# Patient Record
Sex: Male | Born: 1985
Health system: Southern US, Community
[De-identification: ages and names within clinical notes are randomized; demographics above are authoritative.]

## PROBLEM LIST (undated history)

## (undated) DIAGNOSIS — F32A Depression, unspecified: Secondary | ICD-10-CM

## (undated) DIAGNOSIS — R1310 Dysphagia, unspecified: Secondary | ICD-10-CM

## (undated) DIAGNOSIS — K200 Eosinophilic esophagitis: Secondary | ICD-10-CM

## (undated) DIAGNOSIS — G039 Meningitis, unspecified: Secondary | ICD-10-CM

## (undated) DIAGNOSIS — F329 Major depressive disorder, single episode, unspecified: Secondary | ICD-10-CM

## (undated) DIAGNOSIS — T7840XA Allergy, unspecified, initial encounter: Secondary | ICD-10-CM

## (undated) DIAGNOSIS — E7800 Pure hypercholesterolemia, unspecified: Secondary | ICD-10-CM

## (undated) HISTORY — PX: CAUTERIZE INNER NOSE: SHX279

## (undated) HISTORY — DX: Depression, unspecified: F32.A

## (undated) HISTORY — PX: VASECTOMY: SHX75

## (undated) HISTORY — DX: Pure hypercholesterolemia, unspecified: E78.00

## (undated) HISTORY — DX: Eosinophilic esophagitis: K20.0

## (undated) HISTORY — DX: Dysphagia, unspecified: R13.10

## (undated) HISTORY — DX: Allergy, unspecified, initial encounter: T78.40XA

## (undated) HISTORY — DX: Major depressive disorder, single episode, unspecified: F32.9

---

## 2016-07-21 DIAGNOSIS — B354 Tinea corporis: Secondary | ICD-10-CM | POA: Diagnosis not present

## 2016-07-21 DIAGNOSIS — G43711 Chronic migraine without aura, intractable, with status migrainosus: Secondary | ICD-10-CM | POA: Diagnosis not present

## 2016-07-21 DIAGNOSIS — Z1389 Encounter for screening for other disorder: Secondary | ICD-10-CM | POA: Diagnosis not present

## 2016-07-21 DIAGNOSIS — L720 Epidermal cyst: Secondary | ICD-10-CM | POA: Diagnosis not present

## 2017-09-26 ENCOUNTER — Encounter: Payer: Self-pay | Admitting: Physician Assistant

## 2017-09-26 ENCOUNTER — Other Ambulatory Visit: Payer: Self-pay

## 2017-09-26 ENCOUNTER — Ambulatory Visit (INDEPENDENT_AMBULATORY_CARE_PROVIDER_SITE_OTHER): Payer: BLUE CROSS/BLUE SHIELD | Admitting: Physician Assistant

## 2017-09-26 ENCOUNTER — Ambulatory Visit: Payer: Self-pay | Admitting: Physician Assistant

## 2017-09-26 VITALS — BP 130/91 | HR 67 | Temp 98.7°F | Resp 20 | Ht 71.26 in | Wt 180.6 lb

## 2017-09-26 DIAGNOSIS — R1319 Other dysphagia: Secondary | ICD-10-CM

## 2017-09-26 DIAGNOSIS — R1310 Dysphagia, unspecified: Secondary | ICD-10-CM | POA: Diagnosis not present

## 2017-09-26 NOTE — Progress Notes (Signed)
Curtis Grimes  MRN: [id number] DOB: [birth year]  Subjective:  Curtis Grimes is a 32 y.o. male seen in office today for a chief complaint of dysphagia x 1.5-2 years.  It is progressively worsening.  Used to be only with hard food but has now progressed to soft foods.  Tolerates liquids fine.  Each time he eats feels like he gets something stuck at the bottom of his throat.  Has had near choking experiences a few times.  Had mentioned this to a provider about 1 year ago and was given Rx for omeprazole.  He took for 3 to 4 months.  Did not notice a difference.  Has taken Tums consistently for the past 3 months with no relief.  Denies aspirating foods, heartburn, belching, cough, chest pain, pain with swallowing, inability to swallow, weight loss, nausea, vomiting, abdominal pain, constipation, diarrhea, dizziness, lightheadedness, fever, and chills.  Denies new medications. Denies smoking.  Family history of hiatal hernia and father.    Review of Systems  Per HPI  There are no active problems to display for this patient.  Past Medical History:  Diagnosis Date  . Depression     No current outpatient medications on file prior to visit.   No current facility-administered medications on file prior to visit.     No Known Allergies    Social History   Socioeconomic History  . Marital status: Married    Spouse name: Not on file  . Number of children: 2  . Years of education: Not on file  . Highest education level: Not on file  Occupational History  . Not on file  Social Needs  . Financial resource strain: Not on file  . Food insecurity:    Worry: Not on file    Inability: Not on file  . Transportation needs:    Medical: Not on file    Non-medical: Not on file  Tobacco Use  . Smoking status: Never Smoker  . Smokeless tobacco: Never Used  Substance and Sexual Activity  . Alcohol use: Yes    Comment: occ  . Drug use: Never  . Sexual activity: Yes  Lifestyle  . Physical activity:     Days per week: Not on file    Minutes per session: Not on file  . Stress: Not on file  Relationships  . Social connections:    Talks on phone: Not on file    Gets together: Not on file    Attends religious service: Not on file    Active member of club or organization: Not on file    Attends meetings of clubs or organizations: Not on file    Relationship status: Not on file  . Intimate partner violence:    Fear of current or ex partner: Not on file    Emotionally abused: Not on file    Physically abused: Not on file    Forced sexual activity: Not on file  Other Topics Concern  . Not on file  Social History Narrative  . Not on file    Objective:  BP (!) 130/91 (BP Location: Left Arm, Patient Position: Sitting, Cuff Size: Normal)   Pulse 67   Temp 98.7 F (37.1 C) (Oral)   Resp 20   Ht 5' 11.26" (1.81 m)   Wt 180 lb 9.6 oz (81.9 kg)   SpO2 98%   BMI 25.01 kg/m   Physical Exam  Constitutional: He is oriented to person, place, and time. He appears well-developed  and well-nourished. No distress.  HENT:  Head: Normocephalic and atraumatic.  Right Ear: Tympanic membrane, external ear and ear canal normal.  Left Ear: Tympanic membrane, external ear and ear canal normal.  Mouth/Throat: Uvula is midline and mucous membranes are normal. No posterior oropharyngeal edema, posterior oropharyngeal erythema or tonsillar abscesses. No tonsillar exudate.  Eyes: Conjunctivae are normal.  Neck: Normal range of motion and phonation normal. No tracheal tenderness present. No tracheal deviation present. No thyromegaly present.  Cardiovascular: Normal rate, regular rhythm, normal heart sounds and intact distal pulses.  Pulmonary/Chest: Effort normal and breath sounds normal. He has no decreased breath sounds. He has no wheezes. He has no rhonchi. He has no rales.  Lymphadenopathy:       Head (right side): No submental, no submandibular, no tonsillar, no preauricular, no posterior auricular  and no occipital adenopathy present.       Head (left side): No submental, no submandibular, no tonsillar, no preauricular, no posterior auricular and no occipital adenopathy present.    He has no cervical adenopathy.       Right: No supraclavicular adenopathy present.       Left: No supraclavicular adenopathy present.  Neurological: He is alert and oriented to person, place, and time.  Skin: Skin is warm and dry.  Psychiatric: He has a normal mood and affect.  Vitals reviewed.    Wt Readings from Last 3 Encounters:  09/26/17 180 lb 9.6 oz (81.9 kg)    Assessment and Plan :  1. Esophageal dysphagia Pt is asx today.   He is overall well-appearing, no acute distress.  Vitals stable. Due to duration  and worsening of symptoms, recommend evaluation by GI for possible endoscopy or barium study. Will check CBC today for potential anemia. Pt has not experienced weight loss, which is reassuring.  He has not gained any benefit from prior trials of PPI.  Do recommend purchasing over-the-counter Nexium or Prilosec again daily  until evaluated by GI.  Also recommended eating soft foods.  Given education material on diet for dysphasia. Follow up here as needed.  - Ambulatory referral to Gastroenterology - CBC with Differential/Platelet   Benjiman CoreBrittany Vincen Bejar PA-C  [facility]  [facility] 09/26/2017 11:39 AM

## 2017-09-26 NOTE — Patient Instructions (Addendum)
I have placed a referral to GI. They should contact you within the next 2 weeks to schedule an appointment. In the meantime, I recommend purchasing over the counter nexium or prilosec and taking once daily. I also recommend eating soft foods until you are evaluated. Below is info about soft food diet for dysphagia. Follow up here after you seen GI for a complete physical exam.  In terms of elevated blood pressure, I would like you to check your blood pressure at least a couple times over the next week outside of the office and document these values. It is best if you check the blood pressure at different times in the day. Your goal is <140/90. If your values are consistently above this goal, please return to office for further evaluation. If you start to have chest pain, blurred vision, shortness of breath, severe headache, lower leg swelling, or nausea/vomiting please seek care immediately here or at the ED.   Dysphagia Dysphagia is trouble swallowing. This condition occurs when solids and liquids stick in a person's throat on the way down to the stomach, or when food takes longer to get to the stomach. You may have problems swallowing food, liquids, or both. You may also have pain while trying to swallow. It may take you more time and effort to swallow something. What are the causes? This condition is caused by:  Problems with the muscles. They may make it difficult for you to move food and liquids through the tube that connects your mouth to your stomach (esophagus). You may have ulcers, scar tissue, or inflammation that blocks the normal passage of food and liquids. Causes of these problems include: ? Acid reflux from your stomach into your esophagus (gastroesophageal reflux). ? Infections. ? Radiation treatment for cancer. ? Medicines taken without enough fluids to wash them down into your stomach.  Nerve problems. These prevent signals from being sent to the muscles of your esophagus to squeeze  (contract) and move what you swallow down to your stomach.  Globus pharyngeus. This is a common problem that involves feeling like something is stuck in the throat or a sense of trouble with swallowing even though nothing is wrong with the swallowing passages.  Stroke. This can affect the nerves and make it difficult to swallow.  Certain conditions, such as cerebral palsy or Parkinson disease.  What are the signs or symptoms? Common symptoms of this condition include:  A feeling that solids or liquids are stuck in your throat on the way down to the stomach.  Food taking too long to get to the stomach.  Other symptoms include:  Food moving back from your stomach to your mouth (regurgitation).  Noises coming from your throat.  Chest discomfort with swallowing.  A feeling of fullness when swallowing.  Drooling, especially when the throat is blocked.  Pain while swallowing.  Heartburn.  Coughing or gagging while trying to swallow.  How is this diagnosed? This condition is diagnosed by:  Barium X-ray. In this test, you swallow a white substance (contrast medium)that sticks to the inside of your esophagus. X-ray images are then taken.  Endoscopy. In this test, a flexible telescope is inserted down your throat to look at your esophagus and your stomach.  CT scans and MRI.  How is this treated? Treatment for dysphagia depends on the cause of the condition:  If the dysphagia is caused by acid reflux or infection, medicines may be used. They may include antibiotics and heartburn medicines.  If the  dysphagia is caused by problems with your muscles, swallowing therapy may be used to help you strengthen your swallowing muscles. You may have to do specific exercises to strengthen the muscles or stretch them.  If the dysphagia is caused by a blockage or mass, procedures to remove the blockage may be done. You may need surgery and a feeding tube.  You may need to make diet changes.  Ask your health care provider for specific instructions. Follow these instructions at home: Eating and drinking  Try to eat soft food that is easier to swallow.  Follow any diet changes as told by your health care provider.  Cut your food into small pieces and eat slowly.  Eat and drink only when you are sitting upright.  Do not drink alcohol or caffeine. If you need help quitting, ask your health care provider. General instructions  Check your weight every day to make sure you are not losing weight.  Take over-the-counter and prescription medicines only as told by your health care provider.  If you were prescribed an antibiotic medicine, take it as told by your health care provider. Do not stop taking the antibiotic even if you start to feel better.  Do not use any products that contain nicotine or tobacco, such as cigarettes and e-cigarettes. If you need help quitting, ask your health care provider.  Keep all follow-up visits as told by your health care provider. This is important. Contact a health care provider if:  You lose weight because you cannot swallow.  You cough when you drink liquids (aspiration).  You cough up partially digested food. Get help right away if:  You cannot swallow your saliva.  You have shortness of breath or a fever, or both.  You have a hoarse voice and also have trouble swallowing. Summary  Dysphagia is trouble swallowing. This condition occurs when solids and liquids stick in a person's throat on the way down to the stomach, or when food takes longer to get to the stomach.  Dysphagia has many possible causes and symptoms.  Treatment for dysphagia depends on the cause of the condition. This information is not intended to replace advice given to you by your health care provider. Make sure you discuss any questions you have with your health care provider. Document Released: 02/11/2000 Document Revised: 02/03/2016 Document Reviewed:  02/03/2016 Elsevier Interactive Patient Education  2017 Elsevier Inc.  Dysphagia Diet Level 2, Mechanically Altered The dysphagia level 2 diet includes foods that are blended, chopped, ground, or mashed so they are easier to chew and swallow. The foods are soft, moist, and can be chopped into -inch chunks (such as pancakes, pasta, and bananas). In order to be on this diet, you must be able to chew. This diet helps you transition between the pureed textures of the dysphagia level 1 diet to more solid textures. This diet is helpful for people with mild to moderate swallowing difficulties. It reduces the risk of food getting caught in the windpipe, trachea, or lungs. You may need help or supervision during meals while following this diet so that you eat safely. You will be on this diet until your health care provider advances the texture of your diet. What do I need to know about this diet? Foods  You may eat foods that are soft and moist. ? You may need to use a blender, whisk, or masher to soften some of your foods. ? You can moisten foods with gravies, sauces, vegetable or fruit juice, milk, half  and half, or water when blending, mashing, or grinding your foods to the right consistency.  If you were on the dysphagia level 1 diet, you may still eat any of the foods included in that diet.  Avoid foods that are dry, hard, sticky, chewy, coarse, and crunchy. Also avoid large cuts of food.  Take small bites. Each bite should contain  inch or less of food. Liquids  Avoid liquids with seeds and chunks.  Thicken liquids, if instructed by your health care provider. Your health care provider will tell you the consistency to which you should thicken your liquids for safe swallowing. To thicken a liquid, use a commercial thickener or a thickening food (such as rice cereal or potato flakes). Ask your health care provider for specific recommendations on thickeners. See your dietitian or health care  provider regularly for help with your dietary changes. What foods can I eat? Grains Store-bought soft breads that do not have nuts or seeds. Pancakes, sweet rolls, [place] pastries, and [place] toast that have been moistened with syrup or sauce to form a slurry when blended. Well-cooked pasta, noodles, and bread dressing. Well-cooked noodles and pasta in sauce. Moist macaroni and cheese. Soft dumplings or spaetzle with gravy or butter. Cooked cereals (including oatmeal). Low-texture dry cereals, such as rice puff, corn, or wheat-flake cereals, with milk (if thin liquids are not allowed, make sure all of the milk is absorbed by the cereal before eating it). Vegetables Very soft, well-cooked vegetables in pieces less than  inch in size. Cooked potatoes that are moist, not crispy, and with sauce. Fruits Canned or cooked fruits that are soft or moist and do not have skin or seeds. Fresh, soft bananas. Fruit juices with a small amount of pulp (if thin liquids are allowed). Gelatin or plain gelatin with canned fruit, except pineapple. Meat and Other Protein Sources Tender, moist meats, poultry, or fish cooked with gravy or sauce and cubed to -inch bites or smaller. Ground meat. Moist meatball or meatloaf. Fish without bones. Moist casseroles without rice. Tuna, egg, or meat salad without chunks or hard-to-chew vegetables, such as celery and onions. Smooth quiche without large chunks. Scrambled, poached, or soft-cooked eggs with butter, margarine, sauce, or gravy. Tofu. Well-cooked, moistened and mashed beans, peas, baked beans, and other legumes. Casseroles without rice (such as tuna noodle casserole or soft moist meat lasagna). Dairy Cream cheese. Yogurt. Cottage cheese. Ask your health care provider if milk is allowed. Sweets/Desserts Pudding. Custard. Soft fruit pies with crust on the bottom only. Crisps and cobblers without seeds or nuts and with soft crusts. Soft, moist cakes. Icing. Pre-gelled cookies.  Soft, moist cookies dunked in milk, coffee, or another liquid. Jelly. Soft, smooth chocolate bars that are easily chewed. Jams and preserves without seeds. Ask your health care provider whether you can have frozen desserts. Fats and Oils Butter. Margarine. Cream for cereal, depending on liquid consistency allowed. Gravy. Cream sauces. Mayonnaise. Salad dressings. Cream cheese. Cheese spreads, plain or with soft fruits or vegetables added. Sour cream. Sour cream dips with soft fruits or vegetables added. Whipped toppings. Other Sauces and salsas that have soft chunks that are about  inch or smaller. The items listed above may not be a complete list of recommended foods or beverages. Contact your dietitian for more options. What foods are not recommended? Grains All breads not listed in the recommended list. Breads that are hard or have nuts or seeds. Coarse cereals. Cereals that have nuts, seeds, dried fruits, or coconut.  Rice. Corn. Vegetables Whole, raw, frozen, or dried vegetables. Tough, fibrous, chewy, or stringy cooked vegetables, such as celery, peas, broccoli, cabbage, Brussels sprouts, and asparagus. Potato skins. Potato and other vegetable chips. Fried or French-fried potatoes. Cooked corn and peas. Fruits Whole raw, frozen, or dried fruits, including coconut. Pineapple. Fruits with seeds. Meat and Other Protein Sources Dry, tough meats, such as bacon, sausage, and hot dogs. Cheese slices and cubes. Peanut butter. Hard boiled or fried eggs. Nuts. Seeds. Pizza. Sandwiches. Dry casseroles or casseroles with rice or large chunks. Dairy Yogurt with nuts, seeds, or large chunks. Sweets/Desserts Coarse, hard, chewy, or sticky desserts. Any dessert with nuts, seeds, coconut, pineapple, or dried fruit. Ask your health care provider whether you can have frozen desserts. Fats and Oils Avoid fats with chunky, large textures, such as those with nuts or fruits. Other Soups and casseroles with  large chunks. The items listed above may not be a complete list of foods and beverages to avoid. Contact your dietitian for more information. This information is not intended to replace advice given to you by your health care provider. Make sure you discuss any questions you have with your health care provider. Document Released: 02/13/2005 Document Revised: 07/22/2015 Document Reviewed: 01/27/2013 Elsevier Interactive Patient Education  2018 [employer].    IF you received an x-ray today, you will receive an invoice from [facility]. Please contact [facility] at [phone] with questions or concerns regarding your invoice.   IF you received labwork today, you will receive an invoice from [facility]. Please contact [facility] at [phone] with questions or concerns regarding your invoice.   Our billing staff will not be able to assist you with questions regarding bills from these companies.  You will be contacted with the lab results as soon as they are available. The fastest way to get your results is to activate your My Chart account. Instructions are located on the last page of this paperwork. If you have not heard from [place] regarding the results in 2 weeks, please contact this office.

## 2017-09-27 LAB — CBC WITH DIFFERENTIAL/PLATELET
BASOS: 0 %
Basophils Absolute: 0 10*3/uL (ref 0.0–0.2)
EOS (ABSOLUTE): 0.5 10*3/uL — ABNORMAL HIGH (ref 0.0–0.4)
EOS: 6 %
HEMATOCRIT: 46.2 % (ref 37.5–51.0)
Hemoglobin: 15.4 g/dL (ref 13.0–17.7)
IMMATURE GRANS (ABS): 0 10*3/uL (ref 0.0–0.1)
IMMATURE GRANULOCYTES: 0 %
LYMPHS: 32 %
Lymphocytes Absolute: 2.8 10*3/uL (ref 0.7–3.1)
MCH: 29.2 pg (ref 26.6–33.0)
MCHC: 33.3 g/dL (ref 31.5–35.7)
MCV: 88 fL (ref 79–97)
MONOS ABS: 0.6 10*3/uL (ref 0.1–0.9)
Monocytes: 7 %
Neutrophils Absolute: 4.9 10*3/uL (ref 1.4–7.0)
Neutrophils: 55 %
PLATELETS: 295 10*3/uL (ref 150–450)
RBC: 5.27 x10E6/uL (ref 4.14–5.80)
RDW: 13.6 % (ref 12.3–15.4)
WBC: 8.9 10*3/uL (ref 3.4–10.8)

## 2017-10-01 ENCOUNTER — Encounter: Payer: Self-pay | Admitting: Physician Assistant

## 2017-10-03 ENCOUNTER — Ambulatory Visit: Payer: Self-pay | Admitting: Family Medicine

## 2017-11-26 ENCOUNTER — Encounter

## 2017-11-26 ENCOUNTER — Ambulatory Visit (INDEPENDENT_AMBULATORY_CARE_PROVIDER_SITE_OTHER): Payer: BLUE CROSS/BLUE SHIELD | Admitting: Gastroenterology

## 2017-11-26 ENCOUNTER — Encounter: Payer: Self-pay | Admitting: Gastroenterology

## 2017-11-26 VITALS — BP 118/70 | HR 74 | Ht 71.0 in | Wt 182.2 lb

## 2017-11-26 DIAGNOSIS — R1310 Dysphagia, unspecified: Secondary | ICD-10-CM

## 2017-11-26 NOTE — Patient Instructions (Addendum)
If you are age 32 or older, your body mass index should be between 23-30. Your Body mass index is 25.42 kg/m. If this is out of the aforementioned range listed, please consider follow up with your Primary Care Provider.  If you are age 50 or younger, your body mass index should be between 19-25. Your Body mass index is 25.42 kg/m. If this is out of the aformentioned range listed, please consider follow up with your Primary Care Provider.   You have been scheduled for an endoscopy. Please follow written instructions given to you at your visit today. If you use inhalers (even only as needed), please bring them with you on the day of your procedure. Your physician has requested that you go to www.startemmi.com and enter the access code given to you at your visit today. This web site gives a general overview about your procedure. However, you should still follow specific instructions given to you by our office regarding your preparation for the procedure.  Thank you for entrusting me with your care and for choosing [facility], Dr. Ileene Patrick

## 2017-11-26 NOTE — Progress Notes (Signed)
HPI :  32 year old healthy male referred by Magdalene River, PA for dysphagia.  The patient reports experiencing dysphagia for the past year, however this is been worse over the past few months appears to be progressive. He is has dysphagia mostly to solids, however this can rarely occur to liquids. It has been occurring more recently with most meals, often has to drink food to help push things down. Location of the dysphagia is in the upper chest. No odynophagia. It sounds like he had a transient impaction about a month and half ago while at work, food would not come up or go down, eventually passed after bit of time. He denies any impactions for which she's need to seek care in the ER. He has some very mild reflux symptoms at times,  He is been placed on Nexium 20 mg over the past month and a half which has not really helped his dysphagia at all. He denies any abdominal pains or vomiting. He denies any history of asthma or seasonal allergies. He has never had a prior EGD or prior evaluation for this.  He denies any wheezing, does endorse the sense that he needs to take a deep breath at times. He denies any exertional symptoms or chest pain. He thinks his father had a hiatal hernia but no family history of esophageal cancer.  Past Medical History:  Diagnosis Date  . Depression      Past Surgical History:  Procedure Laterality Date  . VASECTOMY     Family History  Problem Relation Age of Onset  . Diabetes Mother   . Heart disease Mother   . Hypertension Mother   . Diabetes Maternal Grandmother   . Heart disease Maternal Grandmother   . Stroke Maternal Grandmother   . Hypertension Paternal Grandmother    Social History   Tobacco Use  . Smoking status: Never Smoker  . Smokeless tobacco: Never Used  Substance Use Topics  . Alcohol use: Yes    Comment: occ  . Drug use: Never   Current Outpatient Medications  Medication Sig Dispense Refill  . esomeprazole (NEXIUM) 20 MG  capsule Take 20 mg by mouth daily at 12 noon.     No current facility-administered medications for this visit.    No Known Allergies   Review of Systems: All systems reviewed and negative except where noted in HPI.   Lab Results  Component Value Date   WBC 8.9 09/26/2017   HGB 15.4 09/26/2017   HCT 46.2 09/26/2017   MCV 88 09/26/2017   PLT 295 09/26/2017      Physical Exam: BP 118/70   Pulse 74   Ht 5\' 11"  (1.803 m)   Wt 182 lb 4 oz (82.7 kg)   BMI 25.42 kg/m  Constitutional: Pleasant,well-developed, male in no acute distress. HEENT: Normocephalic and atraumatic. Conjunctivae are normal. No scleral icterus. Neck supple.  Cardiovascular: Normal rate, regular rhythm.  Pulmonary/chest: Effort normal and breath sounds normal. No wheezing, rales or rhonchi. Abdominal: Soft, nondistended, nontender.  There are no masses palpable. No hepatomegaly. Extremities: no edema Lymphadenopathy: No cervical adenopathy noted. Neurological: Alert and oriented to person place and time. Skin: Skin is warm and dry. No rashes noted. Psychiatric: Normal mood and affect. Behavior is normal.   ASSESSMENT AND PLAN: 32 year old male here for new patient visit to assess the following:  Dysphagia - ongoing over the past year, progressive, with transient impaction last month. I discussed differential for him to include peptic stricture, eosinophilic  esophagitis, motility disorder, mass lesion (the latter which would be unusual at his age), etc. I'm recommending an EGD to further evaluate and potentially treat with dilation if amenable. He does have a mild peripheral eosinophilia, need to rule out EoE. I discussed the risks and benefits of EGD and anesthesia with him and he wanted to proceed. Further recommendations pending the results. He will continue Nexium until this is done. All questions answered he agreed with the plan.  Ileene Patrick, MD [facility]  CC: Benjiman Core  D, PA*

## 2017-11-27 ENCOUNTER — Ambulatory Visit (AMBULATORY_SURGERY_CENTER): Payer: BLUE CROSS/BLUE SHIELD | Admitting: Gastroenterology

## 2017-11-27 ENCOUNTER — Encounter: Payer: Self-pay | Admitting: Gastroenterology

## 2017-11-27 VITALS — BP 117/84 | HR 61 | Temp 98.9°F | Resp 23 | Ht 71.0 in | Wt 182.0 lb

## 2017-11-27 DIAGNOSIS — R1319 Other dysphagia: Secondary | ICD-10-CM

## 2017-11-27 DIAGNOSIS — R1310 Dysphagia, unspecified: Secondary | ICD-10-CM | POA: Diagnosis not present

## 2017-11-27 DIAGNOSIS — K222 Esophageal obstruction: Secondary | ICD-10-CM

## 2017-11-27 DIAGNOSIS — K228 Other specified diseases of esophagus: Secondary | ICD-10-CM

## 2017-11-27 DIAGNOSIS — K219 Gastro-esophageal reflux disease without esophagitis: Secondary | ICD-10-CM | POA: Diagnosis not present

## 2017-11-27 MED ORDER — SODIUM CHLORIDE 0.9 % IV SOLN: 500.0000 mL | Freq: Once | INTRAVENOUS | Status: DC

## 2017-11-27 NOTE — Progress Notes (Signed)
Report to PACU, RN, vss, BBS= Clear.  

## 2017-11-27 NOTE — Op Note (Signed)
[facility] Patient Name: Curtis Grimes Procedure Date: 11/27/2017 1:20 PM MRN: [id number] Endoscopist: Viviann Spare P. Adela Lank , MD Age: 32 Referring MD:  Date of Birth: 1985/03/25 Gender: Male Account #: [id number] Procedure:                Upper GI endoscopy Indications:              Dysphagia Medicines:                Monitored Anesthesia Care Procedure:                Pre-Anesthesia Assessment:                           - Prior to the procedure, a History and Physical                            was performed, and patient medications and                            allergies were reviewed. The patient's tolerance of                            previous anesthesia was also reviewed. The risks                            and benefits of the procedure and the sedation                            options and risks were discussed with the patient.                            All questions were answered, and informed consent                            was obtained. Prior Anticoagulants: The patient has                            taken no previous anticoagulant or antiplatelet                            agents. ASA Grade Assessment: II - A patient with                            mild systemic disease. After reviewing the risks                            and benefits, the patient was deemed in                            satisfactory condition to undergo the procedure.                           After obtaining informed consent, the endoscope was  passed under direct vision. Throughout the                            procedure, the patient's blood pressure, pulse, and                            oxygen saturations were monitored continuously. The                            Model GIF-HQ190 [id number]) scope was introduced                            through the mouth, and advanced to the second part                            of duodenum. The upper GI endoscopy was                           accomplished without difficulty. The patient                            tolerated the procedure well. Scope In: Scope Out: Findings:                 Esophagogastric landmarks were identified: the                            Z-line was found at 40 cm, the gastroesophageal                            junction was found at 40 cm and the upper extent of                            the gastric folds was found at 40 cm from the                            incisors.                           One benign-appearing, intrinsic mild stenosis was                            found 40 cm from the incisors. This stenosis                            measured less than one cm (in length). A TTS                            dilator was passed through the scope. Dilation with                            a 16-17-18 mm balloon dilator was performed to 16  mm and 17 mm at which point an appropriate mucosal                            wrent was apprecited.                           Mucosal changes including ringed esophagus and                            longitudinal furrows were found in the middle third                            of the esophagus and in the lower third of the                            esophagus. Biopsies were obtained from the proximal                            and distal esophagus with cold forceps for                            histology of suspected eosinophilic esophagitis.                           The exam of the esophagus was otherwise normal.                           The entire examined stomach was normal.                           The duodenal bulb and second portion of the                            duodenum were normal. Complications:            No immediate complications. Estimated blood loss:                            Minimal. Estimated Blood Loss:     Estimated blood loss was minimal. Impression:               - Esophagogastric  landmarks identified.                           - Benign-appearing esophageal stenosis. Dilated to                            17mm with good result.                           - Esophageal mucosal changes suspicious for                            eosinophilic esophagitis. Biopsied.                           -  Normal stomach.                           - Normal duodenal bulb and second portion of the                            duodenum. Recommendation:           - Patient has a contact number available for                            emergencies. The signs and symptoms of potential                            delayed complications were discussed with the                            patient. Return to normal activities tomorrow.                            Written discharge instructions were provided to the                            patient.                           - Post-dilation diet.                           - Continue present medications.                           - Await pathology results and course post-dilation                            with further recommendations Viviann Spare P. Armbruster, MD 11/27/2017 1:38:53 PM This report has been signed electronically.

## 2017-11-27 NOTE — Patient Instructions (Signed)
*   handout on stricture and dilation diet instructions given*  YOU HAD AN ENDOSCOPIC PROCEDURE TODAY AT THE [facility]:   Refer to the procedure report that was given to you for any specific questions about what was found during the examination.  If the procedure report does not answer your questions, please call your gastroenterologist to clarify.  If you requested that your care partner not be given the details of your procedure findings, then the procedure report has been included in a sealed envelope for you to review at your convenience later.  YOU SHOULD EXPECT: Some feelings of bloating in the abdomen. Passage of more gas than usual.  Walking can help get rid of the air that was put into your GI tract during the procedure and reduce the bloating. If you had a lower endoscopy (such as a colonoscopy or flexible sigmoidoscopy) you may notice spotting of blood in your stool or on the toilet paper. If you underwent a bowel prep for your procedure, you may not have a normal bowel movement for a few days.  Please Note:  You might notice some irritation and congestion in your nose or some drainage.  This is from the oxygen used during your procedure.  There is no need for concern and it should clear up in a day or so.  SYMPTOMS TO REPORT IMMEDIATELY:   Following upper endoscopy (EGD)  Vomiting of blood or coffee ground material  New chest pain or pain under the shoulder blades  Painful or persistently difficult swallowing  New shortness of breath  Fever of 100F or higher  Black, tarry-looking stools  For urgent or emergent issues, a gastroenterologist can be reached at any hour by calling [phone].   DIET:  Nothing to eat or drink until 2:40, then clear liquids until 3:40 , then soft foods for the rest of today. Tommorrow  you may proceed to your regular diet.  Drink plenty of fluids but you should avoid alcoholic beverages for 24 hours.  ACTIVITY:  You should plan to take  it easy for the rest of today and you should NOT DRIVE or use heavy machinery until tomorrow (because of the sedation medicines used during the test).    FOLLOW UP: Our staff will call the number listed on your records the next business day following your procedure to check on you and address any questions or concerns that you may have regarding the information given to you following your procedure. If we do not reach you, we will leave a message.  However, if you are feeling well and you are not experiencing any problems, there is no need to return our call.  We will assume that you have returned to your regular daily activities without incident.  If any biopsies were taken you will be contacted by phone or by letter within the next 1-3 weeks.  Please call us at [phone] if you have not heard about the biopsies in 3 weeks.    SIGNATURES/CONFIDENTIALITY: You and/or your care partner have signed paperwork which will be entered into your electronic medical record.  These signatures attest to the fact that that the information above on your After Visit Summary has been reviewed and is understood.  Full responsibility of the confidentiality of this discharge information lies with you and/or your care-partner.

## 2017-11-27 NOTE — Progress Notes (Signed)
Called to room to assist during endoscopic procedure.  Patient ID and intended procedure confirmed with present staff. Received instructions for my participation in the procedure from the performing physician.  

## 2017-11-28 ENCOUNTER — Ambulatory Visit (HOSPITAL_COMMUNITY)
Admission: EM | Admit: 2017-11-28 | Discharge: 2017-11-28 | Disposition: A | Discharge status: Home / Self Care | Payer: BLUE CROSS/BLUE SHIELD | Attending: Family Medicine | Admitting: Family Medicine

## 2017-11-28 ENCOUNTER — Ambulatory Visit (INDEPENDENT_AMBULATORY_CARE_PROVIDER_SITE_OTHER): Payer: BLUE CROSS/BLUE SHIELD

## 2017-11-28 ENCOUNTER — Encounter (HOSPITAL_COMMUNITY): Payer: Self-pay | Admitting: Emergency Medicine

## 2017-11-28 ENCOUNTER — Telehealth: Payer: Self-pay

## 2017-11-28 DIAGNOSIS — R079 Chest pain, unspecified: Secondary | ICD-10-CM | POA: Diagnosis not present

## 2017-11-28 DIAGNOSIS — R0602 Shortness of breath: Secondary | ICD-10-CM

## 2017-11-28 MED ORDER — GI COCKTAIL ~~LOC~~
ORAL | Status: AC
  Filled 2017-11-28: qty 30

## 2017-11-28 MED ORDER — GI COCKTAIL ~~LOC~~
30.0000 mL | Freq: Once | ORAL | Status: AC
  Administered 2017-11-28: 30 mL via ORAL

## 2017-11-28 NOTE — Telephone Encounter (Signed)
  Follow up Call-  Call back number 11/27/2017  Post procedure Call Back phone  # #[phone] cell  Permission to leave phone message Yes     Patient questions:  Do you have a fever, pain , or abdominal swelling? No. Pain Score  0 *  Have you tolerated food without any problems? Yes.    Have you been able to return to your normal activities? Yes.    Do you have any questions about your discharge instructions: Diet   No. Medications  No. Follow up visit  No.  Do you have questions or concerns about your Care? No.  Actions: * If pain score is 4 or above: No action needed, pain <4.   No problems noted per pt. maw

## 2017-11-28 NOTE — Discharge Instructions (Addendum)
Your EKG is normal today. Your chest xray is normal as well. There is one small area that is recommended to be rechecked in 1 month as is likely an incidental finding.  You don't have any significant risk factors for blood clot or cardiac event. Your vitals are stable.  Continue with your nexium daily.  Please follow up with your primary care provider for recheck in the next 1-2 weeks.  If develop worsening of pain, shortness of breath , difficulty breathing or otherwise worsening please return or go to the Er.

## 2017-11-28 NOTE — ED Provider Notes (Signed)
[facility]    CSN: [id number] Arrival date & time: 11/28/17  1437     History   Chief Complaint Chief Complaint  Patient presents with  . Chest Pain    HPI Curtis Grimes is a 32 y.o. male.   Curtis Grimes presents with complaints of sensation of shortness of breath  Like it is difficulty to take a deep breath. Comes and goes intermittently but has become more frequent. States at times feels it is hard to take a deep breath. No cough. Started 3 days ago. Has also developed some epigastric pain. Is not worse with eating. Had upper endoscopy with GI yesterday for esophageal dilation related to stricture. He was hopeful symptoms would improve with this but they have not. No recent travel, no leg pain or swelling. No triggers or exacerbating factors. No nausea or vomiting. Eating does not worsen symptoms. Pain 3/10. Doesn't smoke. Denies any previous similar. Sleeping fine, only occasionally wakes up with the sensation of needing to take a deep breath but occurs only occasionally. Takes nexium daily.     ROS per HPI.      Past Medical History:  Diagnosis Date  . Depression     There are no active problems to display for this patient.   Past Surgical History:  Procedure Laterality Date  . VASECTOMY         Home Medications    Prior to Admission medications   Medication Sig Start Date End Date Taking? Authorizing Provider  esomeprazole (NEXIUM) 20 MG capsule Take 20 mg by mouth daily at 12 noon.    [provider]  ibuprofen (ADVIL,MOTRIN) 200 MG tablet Take 200 mg by mouth every 6 (six) hours as needed.    [provider]  vitamin C (ASCORBIC ACID) 500 MG tablet Take 500 mg by mouth daily.    [provider]    Family History Family History  Problem Relation Age of Onset  . Diabetes Mother   . Heart disease Mother   . Hypertension Mother   . Diabetes Maternal Grandmother   . Heart disease Maternal Grandmother   . Stroke Maternal  Grandmother   . Hypertension Paternal Grandmother   . Colon cancer Neg Hx   . Esophageal cancer Neg Hx   . Rectal cancer Neg Hx   . Stomach cancer Neg Hx     Social History Social History   Tobacco Use  . Smoking status: Never Smoker  . Smokeless tobacco: Never Used  Substance Use Topics  . Alcohol use: Yes    Comment: occ  . Drug use: Never     Allergies   Patient has no known allergies.   Review of Systems Review of Systems   Physical Exam Triage Vital Signs ED Triage Vitals [11/28/17 1537]  Enc Vitals Group     BP (!) 138/92     Pulse Rate 81     Resp 18     Temp 98.6 F (37 C)     Temp Source Oral     SpO2 100 %     Weight      Height      Head Circumference      Peak Flow      Pain Score      Pain Loc      Pain Edu?      Excl. in GC?    No data found.  Updated Vital Signs BP (!) 138/92 (BP Location: Left Arm)   Pulse  81   Temp 98.6 F (37 C) (Oral)   Resp 18   SpO2 100%    Physical Exam  Constitutional: He is oriented to person, place, and time. He appears well-developed and well-nourished.  Cardiovascular: Normal rate and regular rhythm.  Pulmonary/Chest: Effort normal and breath sounds normal.  Abdominal: Soft. Bowel sounds are normal. He exhibits no distension. There is tenderness in the epigastric area.  Neurological: He is alert and oriented to person, place, and time.  Skin: Skin is warm and dry.   EKG NSR without acute findings, rate 81.   UC Treatments / Results  Labs (all labs ordered are listed, but only abnormal results are displayed) Labs Reviewed - No data to display  EKG None  Radiology Dg Chest 2 View  Result Date: 11/28/2017 CLINICAL DATA:  Chest pain with difficulty catching breath for 4 days. Post endoscopy. EXAM: CHEST - 2 VIEW COMPARISON:  None. FINDINGS: The heart size and mediastinal contours are normal. There is a questionable small nodule peripherally in the mid left lung on the frontal examination. This is  not seen on the lateral view, and could be due to an overlap of vascular and osseous structures. There is no confluent airspace opacity, pleural effusion or pneumothorax. No acute osseous findings are demonstrated. No free intraperitoneal air. IMPRESSION: No acute or suspicious findings. Questionable small nodule peripherally in the left lung, of doubtful significance given the patient's young age. Follow-up PA view in 1 months suggested. Electronically Signed   By: Carey Bullocks M.D.   On: 11/28/2017 16:14    Procedures Procedures (including critical care time)  Medications Ordered in UC Medications  gi cocktail (Maalox,Lidocaine,Donnatal) (30 mLs Oral Given 11/28/17 1602)    Initial Impression / Assessment and Plan / UC Course  I have reviewed the triage vital signs and the nursing notes.  Pertinent labs & imaging results that were available during my care of the patient were reviewed by me and considered in my medical decision making (see chart for details).     Non toxic in appearance. Afebrile. Chest xray and ekg reassuring here today. No cardiac risk factors. No tachycardia, tachypnea, hypoxia. Afebrile. No PE risk factors. Encouraged recheck with PCP in the next 1-2 weeks. Return precautions provided. Patient verbalized understanding and agreeable to plan.  Ambulatory out of clinic without difficulty.   Final Clinical Impressions(s) / UC Diagnoses   Final diagnoses:  Shortness of breath     Discharge Instructions     Your EKG is normal today. Your chest xray is normal as well. There is one small area that is recommended to be rechecked in 1 month as is likely an incidental finding.  You don't have any significant risk factors for blood clot or cardiac event. Your vitals are stable.  Continue with your nexium daily.  Please follow up with your primary care provider for recheck in the next 1-2 weeks.  If develop worsening of pain, shortness of breath , difficulty breathing or  otherwise worsening please return or go to the Er.     ED Prescriptions    None     Controlled Substance Prescriptions [place] Controlled Substance Registry consulted? Not Applicable   Georgetta Haber, NP 11/28/17 [phone]

## 2017-11-28 NOTE — ED Triage Notes (Signed)
Pt sts increased SOB x 3 days; pt had endoscopy yesterday with dilation and now having pain today

## 2017-12-04 ENCOUNTER — Other Ambulatory Visit: Payer: Self-pay

## 2017-12-04 DIAGNOSIS — K200 Eosinophilic esophagitis: Secondary | ICD-10-CM

## 2017-12-04 MED ORDER — FLUTICASONE PROPIONATE HFA 220 MCG/ACT IN AERO: INHALATION_SPRAY | RESPIRATORY_TRACT | 1 refills | Status: DC

## 2017-12-06 ENCOUNTER — Telehealth: Payer: Self-pay | Admitting: Gastroenterology

## 2017-12-06 NOTE — Telephone Encounter (Signed)
Patient states medication fluticasone with insurance is still over $100. Patient wanting to know if there is a similar medication that can be prescribed that is cheaper.

## 2017-12-07 NOTE — Telephone Encounter (Signed)
Called and spoke to pt.  Relayed recommendation. Patient expressed understanding and will shop price for script and let us know if he wants [place] to write for something different to see how his insurance covers other options.

## 2017-12-07 NOTE — Telephone Encounter (Signed)
Jan unfortunately there is no other option that I think would be cheaper. We can try using budesonide respules but they are typically more expensive. Not sure if he wants to shop around at a few pharmacies to see if he can find a better deal at another pharmacy.

## 2017-12-12 DIAGNOSIS — S0083XA Contusion of other part of head, initial encounter: Secondary | ICD-10-CM | POA: Diagnosis not present

## 2017-12-12 DIAGNOSIS — S40022A Contusion of left upper arm, initial encounter: Secondary | ICD-10-CM | POA: Diagnosis not present

## 2018-01-07 ENCOUNTER — Encounter: Payer: Self-pay | Admitting: Allergy

## 2018-01-07 ENCOUNTER — Ambulatory Visit (INDEPENDENT_AMBULATORY_CARE_PROVIDER_SITE_OTHER): Payer: BLUE CROSS/BLUE SHIELD | Admitting: Allergy

## 2018-01-07 VITALS — BP 126/78 | HR 76 | Temp 97.5°F | Resp 18 | Ht 70.0 in | Wt 182.0 lb

## 2018-01-07 DIAGNOSIS — R0602 Shortness of breath: Secondary | ICD-10-CM

## 2018-01-07 DIAGNOSIS — K200 Eosinophilic esophagitis: Secondary | ICD-10-CM | POA: Diagnosis not present

## 2018-01-07 DIAGNOSIS — J3089 Other allergic rhinitis: Secondary | ICD-10-CM | POA: Diagnosis not present

## 2018-01-07 DIAGNOSIS — T781XXD Other adverse food reactions, not elsewhere classified, subsequent encounter: Secondary | ICD-10-CM

## 2018-01-07 MED ORDER — BUDESONIDE 0.5 MG/2ML IN SUSP: RESPIRATORY_TRACT | 5 refills | Status: DC

## 2018-01-07 NOTE — Assessment & Plan Note (Signed)
Issues with swallowing food for the past few years and worse in the last 6 months.  No specific food triggers noted. EGD in October 2019 showed elevated eosinophils and required dilatation - in process of obtaining records regarding exact number of eosinophils from pathology.  Currently on Nexium 40 milligrams daily with some benefit.  Did not start Flovent due to cost.  Today skin testing was positive to shellfish, pork, [place], chicken, beef and lamb.  Gave handout on [employer].  Start to avoid above foods.  Continue nexium 40mg  daily.  Start budesonide 0.5mg  (mix 2 vials with 10 packets of splenda or apple sauce to clear a slurry) and take twice a day. NOTHING to eat or drink for 30 minutes afterwards.  Get repeat EGD in 2 months.

## 2018-01-07 NOTE — Progress Notes (Signed)
New Patient Note  RE: Curtis Grimes MRN: [id number] DOB: 1986-02-07 Date of Office Visit: 01/07/2018  Referring provider: Benancio Deeds, MD Primary care provider: Magdalene River, PA-C  Chief Complaint: Eozinophilic Esophagitis (endoscopy revealed scarring that is indicative of food allergies. patient denies any symptoms or reactions to foods. )  History of Present Illness: I had the pleasure of seeing Curtis Grimes for [facility] evaluation [facility]  on 01/07/2018. He is a 32 y.o. male, who is referred here by GI (Dr. Adela Lank) for the evaluation of food allergies.   Patient had EGD  He has been having issues with swallowing foods for the past few years and worsening the past 6 months. He had a few episodes of food getting stuck in the esophagus which only needed heimlich maneuver and no emergent EGD. No specific triggers noted. No issues with swallowing liquids but has issues even with softer foods.  He had EGD in October which showed a stricture requiring dilatation and elevated eosinophils.    Patient has been on Nexium 40mg  daily with some benefit. He did not start Flovent due to cost.  Last episode of food getting stuck was prior to the scope.  11/27/2017: Surgical [P], mid esophagus and distal esophagus - SQUAMOUS MUCOSA WITH INCREASED EOSINOPHILS CONSISTENT WITH EOSINOPHILIC ESOPHAGITIS. - PAS STAIN NEGATIVE FOR FUNGUS. - NO DYSPLASIA OR MALIGNANCY.  Assessment and Plan: Curtis Grimes is a 32 y.o. male with: Eosinophilic esophagitis Issues with swallowing food for the past few years and worse in the last 6 months.  No specific food triggers noted. EGD in October 2019 showed elevated eosinophils and required dilatation - in process of obtaining records regarding exact number of eosinophils from pathology.  Currently on Nexium 40 milligrams daily with some benefit.  Did not start Flovent due to cost.  Today skin testing was positive to shellfish,  pork, [place], chicken, beef and lamb.  Gave handout on Eoe.  Start to avoid above foods.  Continue nexium 40mg  daily.  Start budesonide 0.5mg  (mix 2 vials with 10 packets of splenda or apple sauce to clear a slurry) and take twice a day. NOTHING to eat or drink for 30 minutes afterwards.  Get repeat EGD in 2 months.   Shortness of breath SOB for the past few months. No history of asthma.  Today's spirometry was normal.  Monitor symptoms.  If symptoms worsen then will consider adding inhaler.  Other allergic rhinitis Rhinitis symptoms for the past 2 years mainly during the fall.  Zyrtec with good benefit.  Unable to test today for the environmental allergies. Consider testing in the future.  May use over the counter antihistamines such as Zyrtec (cetirizine), Claritin (loratadine), Allegra (fexofenadine), or Xyzal (levocetirizine) daily as needed.  Return in about 3 months (around 04/09/2018).  Meds ordered this encounter  Medications  . budesonide (PULMICORT) 0.5 MG/2ML nebulizer solution    Sig: Mix 2 vials with 10 pkts splenda OR apple sauce twice a day. NOTHING to eat or drink for 30 min afterwards.    Dispense:  120 mL    Refill:  5    This is for his eoe.   Other allergy screening: Asthma: no  Can't take a deep breath in at times but no other history of asthma or reactive airway disease.  Rhino conjunctivitis: yes  He reports symptoms of itchy eyes, rhinorrhea, sneezing. Symptoms have been going on for 2 years. The symptoms are present during the fall. He  has used zyrtec with fair improvement in symptoms. Sinus infections: none. Previous work up includes: none. Food allergy: no Medication allergy: no Hymenoptera allergy: no Urticaria: no Eczema:no History of recurrent infections suggestive of immunodeficency: no  Diagnostics: Spirometry:  Tracings reviewed. His effort: Good reproducible efforts. FVC: 4.96L FEV1: 4.17L, 94% predicted FEV1/FVC ratio:  84% Interpretation: Spirometry consistent with normal pattern.  Please see scanned spirometry results for details.  Skin Testing: Food allergy panel. Positive test to: shellfish, pork, [place], chicken, beef and lamb. Results discussed with patient/family. Food Adult Perc - 01/07/18 1400    Time Antigen Placed  0300    Allergen Manufacturer  Waynette Buttery    Location  Back    Number of allergen test  74     Control-buffer 50% Glycerol  Negative    Control-Histamine 1 mg/ml  4+    1. Peanut  Negative    2. Soybean  Negative    3. Wheat  Negative    4. Sesame  Negative    5. Milk, cow  Negative    6. Egg White, Chicken  Negative    7. Casein  Negative    8. Shellfish Mix  2+    9. Fish Mix  Negative    10. Cashew  Negative    11. Pecan Food  Negative    12. Walnut Food  Negative    13. Almond  Negative    14. Hazelnut  Negative    15. [place] nut  Negative    16. Coconut  Negative    17. Pistachio  Negative    18. Catfish  Negative    19. Bass  Negative    20. Trout  Negative    21. Tuna  Negative    22. Salmon  Negative    23. Flounder  Negative    24. Codfish  Negative    25. Shrimp  3+    26. Crab  3+    27. Lobster  3+    28. Oyster  Negative    29. Scallops  Negative    30. Barley  Negative    31. Oat   Negative    32. Rye   Negative    33. Hops  Negative    34. Rice  Negative    35. Cottonseed  Negative    36. Saccharomyces Cerevisiae   Negative    37. Pork  2+    38. [place] Meat  2+    39. Chicken Meat  2+    40. Beef  2+    41. Lamb  3+    42. Tomato  Negative    43. White Potato  Negative    44. Sweet Potato  Negative    45. Pea, Green/English  Negative    46. Navy Bean  Negative    47. Mushrooms  Negative    48. Avocado  Negative    49. Onion  Negative    50. Cabbage  Negative    51. Carrots  Negative    52. Celery  Negative    53. Corn  Negative    54. Cucumber  Negative    55. Grape (White seedless)  Negative    56. Orange   Negative    57. Banana   Negative    58. Apple  Negative    59. Peach  Negative    60. Strawberry  Negative    61. Cantaloupe  Negative    62. Watermelon  Negative  63. Pineapple  Negative    64. Chocolate/Cacao bean  Negative    65. Karaya Gum  Negative    66. Acacia (Arabic Gum)  Negative    67. Cinnamon  Negative    68. Nutmeg  Negative    69. Ginger  Negative    70. Garlic  Negative    71. Pepper, black  Negative    72. Mustard  Negative       Past Medical History: Patient Active Problem List   Diagnosis Date Noted  . Eosinophilic esophagitis 01/07/2018  . Adverse food reaction 01/07/2018  . Shortness of breath 01/07/2018  . Other allergic rhinitis 01/07/2018   Past Medical History:  Diagnosis Date  . Depression   . Dysphagia    Past Surgical History: Past Surgical History:  Procedure Laterality Date  . VASECTOMY     Medication List:  Current Outpatient Medications  Medication Sig Dispense Refill  . esomeprazole (NEXIUM) 40 MG capsule Take by mouth.    Marland Kitchen ibuprofen (ADVIL,MOTRIN) 200 MG tablet Take 200 mg by mouth every 6 (six) hours as needed.    . vitamin C (ASCORBIC ACID) 500 MG tablet Take 500 mg by mouth daily.    . budesonide (PULMICORT) 0.5 MG/2ML nebulizer solution Mix 2 vials with 10 pkts splenda OR apple sauce twice a day. NOTHING to eat or drink for 30 min afterwards. 120 mL 5   No current facility-administered medications for this visit.    Allergies: No Known Allergies Social History: Social History   Socioeconomic History  . Marital status: Married    Spouse name: Not on file  . Number of children: 2  . Years of education: Not on file  . Highest education level: Not on file  Occupational History  . Not on file  Social Needs  . Financial resource strain: Not on file  . Food insecurity:    Worry: Not on file    Inability: Not on file  . Transportation needs:    Medical: Not on file    Non-medical: Not on file  Tobacco Use  . Smoking status: Never Smoker   . Smokeless tobacco: Never Used  Substance and Sexual Activity  . Alcohol use: Yes    Comment: occ  . Drug use: Never  . Sexual activity: Yes  Lifestyle  . Physical activity:    Days per week: Not on file    Minutes per session: Not on file  . Stress: Not on file  Relationships  . Social connections:    Talks on phone: Not on file    Gets together: Not on file    Attends religious service: Not on file    Active member of club or organization: Not on file    Attends meetings of clubs or organizations: Not on file    Relationship status: Not on file  Other Topics Concern  . Not on file  Social History Narrative  . Not on file   Lives in a 32 year old home. Smoking: denies Occupation: Patent attorney HistorySurveyor, minerals in the house: yes Carpet in the family room: no Carpet in the bedroom: yes Heating: gas Cooling: central Pet: yes 2 dogs  Family History: Family History  Problem Relation Age of Onset  . Diabetes Mother   . Heart disease Mother   . Hypertension Mother   . Diabetes Maternal Grandmother   . Heart disease Maternal Grandmother   . Stroke Maternal Grandmother   . Hypertension Paternal  Grandmother   . Colon cancer Neg Hx   . Esophageal cancer Neg Hx   . Rectal cancer Neg Hx   . Stomach cancer Neg Hx    Review of Systems  Constitutional: Negative for appetite change, chills, fever and unexpected weight change.  HENT: Negative for congestion and rhinorrhea.   Eyes: Negative for itching.  Respiratory: Positive for chest tightness and shortness of breath. Negative for cough and wheezing.   Cardiovascular: Negative for chest pain.  Gastrointestinal: Negative for abdominal pain.  Genitourinary: Negative for difficulty urinating.  Skin: Negative for rash.  Allergic/Immunologic: Positive for environmental allergies. Negative for food allergies.  Neurological: Negative for headaches.   Objective: BP 126/78 (BP Location: Left Arm,  Patient Position: Sitting, Cuff Size: Normal)   Pulse 76   Temp (!) 97.5 F (36.4 C) (Oral)   Resp 18   Ht 5\' 10"  (1.778 m)   Wt 182 lb (82.6 kg)   SpO2 98%   BMI 26.11 kg/m  Body mass index is 26.11 kg/m. Physical Exam  Constitutional: He is oriented to person, place, and time. He appears well-developed and well-nourished.  HENT:  Head: Normocephalic and atraumatic.  Right Ear: External ear normal.  Left Ear: External ear normal.  Nose: Nose normal.  Mouth/Throat: Oropharynx is clear and moist.  Eyes: Conjunctivae and EOM are normal.  Neck: Neck supple.  Cardiovascular: Normal rate, regular rhythm and normal heart sounds. Exam reveals no gallop and no friction rub.  No murmur heard. Pulmonary/Chest: Effort normal and breath sounds normal. He has no wheezes. He has no rales.  Abdominal: Soft. Bowel sounds are normal. There is no tenderness.  Lymphadenopathy:    He has no cervical adenopathy.  Neurological: He is alert and oriented to person, place, and time.  Skin: Skin is warm. No rash noted.  Psychiatric: He has a normal mood and affect. His behavior is normal.  Nursing note and vitals reviewed.  The plan was reviewed with the patient/family, and all questions/concerned were addressed.  It was my pleasure to see Kaelon today and participate in his care. Please feel free to contact me with any questions or concerns.  Sincerely,  Wyline Mood, DO Allergy & Immunology  [facility] office: [phone] [facility] [phone]

## 2018-01-07 NOTE — Assessment & Plan Note (Signed)
Rhinitis symptoms for the past 2 years mainly during the fall.  Zyrtec with good benefit.  Unable to test today for the environmental allergies. Consider testing in the future.  May use over the counter antihistamines such as Zyrtec (cetirizine), Claritin (loratadine), Allegra (fexofenadine), or Xyzal (levocetirizine) daily as needed.

## 2018-01-07 NOTE — Assessment & Plan Note (Addendum)
SOB for the past few months. No history of asthma.  Today's spirometry was normal.  Monitor symptoms.  If symptoms worsen then will consider adding inhaler.

## 2018-01-07 NOTE — Patient Instructions (Addendum)
Today's testing showed: positive to shellfish, pork, [place], chicken, beef and lamb. Start to avoid above foods.  Continue nexium 40mg  daily. Start budesonide 0.5mg  (mix 2 vials with 10 packets of splenda or apple sauce to clear a slurry) and take twice a day. NOTHING to eat or drink for 30 minutes afterwards.  Follow up in 3 months.

## 2018-01-08 ENCOUNTER — Telehealth: Payer: Self-pay

## 2018-01-08 NOTE — Telephone Encounter (Signed)
I contacted [facility] and the receptionist is sending an email to the resulting pathologist (Dr. Luisa HartPatrick) requesting this information.

## 2018-01-08 NOTE — Telephone Encounter (Signed)
-----   Message from Ellamae SiaYoon M Kim, DO sent at 01/07/2018  5:10 PM EST ----- Regarding: EGD biopsy Can someone call the pathologist office who read the slides on his EGD to see if they can give us the number of eosinophils noted on the slide? I need the specific number of eosinophils not just elevated. Thank you.

## 2018-01-08 NOTE — Progress Notes (Signed)
Recall already in place for office visit.

## 2018-01-09 ENCOUNTER — Telehealth: Payer: Self-pay | Admitting: Gastroenterology

## 2018-01-09 NOTE — Telephone Encounter (Signed)
I spoke with receptionist at the pathology lab again this morning. She advised that Dr. Luisa HartPatrick is going to pull the slides and fax over an addendum to our office.

## 2018-01-09 NOTE — Telephone Encounter (Signed)
Routed to Dr. Armbruster. 

## 2018-01-09 NOTE — Telephone Encounter (Signed)
Patient states he went to the allergist as suggested and was told he needed to do a repeat endo in Dec with Dr.Armbruster. Patient is wanting to know what Dr.Armbruster suggests.

## 2018-01-10 NOTE — Telephone Encounter (Signed)
Curtis FanningJulie may be best to book him for a clinic visit with me in December or so to discuss. He has seen an allergist, has multiple food allergies which he should avoid, and was placed on budesonide slurry. If his symptoms resolve with these interventions we may not need to do it, but want to discuss further with him. Thanks

## 2018-01-10 NOTE — Telephone Encounter (Signed)
Please contact patient and schedule office visit with Dr. Adela LankArmbruster, thank you.

## 2018-01-11 NOTE — Telephone Encounter (Signed)
Patient scheduled for ov 12.17.19.

## 2018-01-14 NOTE — Telephone Encounter (Signed)
Any update on this pathology report?

## 2018-01-14 NOTE — Telephone Encounter (Signed)
Unfortunately there have not been any pathology reports faxed to us as of this early morning and afternoon.

## 2018-01-21 NOTE — Telephone Encounter (Signed)
Fax has been received it is in nurses station will give to Dr Selena BattenKim on 01/22/18 when she is in office

## 2018-01-21 NOTE — Telephone Encounter (Signed)
Can somebody please call the office again and request the pathology slides with the number of eosinophils. See previous messages.  Thank you.

## 2018-01-21 NOTE — Telephone Encounter (Signed)
I have spoken with the receptionist.  She stated that she was going to fax report from Dr. Luisa HartPatrick.  Will let you know once I have it.

## 2018-01-22 ENCOUNTER — Telehealth: Payer: Self-pay | Admitting: *Deleted

## 2018-01-22 NOTE — Telephone Encounter (Signed)
Received the fax but it is the same pathology report as before which DOES NOT have the number of eosinophils on it. I need the specific numbers not just "increased" eosinophils.   Please call back and request if the pathologist can re-read the slide and put the number of eosinophils on the report.   Thank you.

## 2018-01-22 NOTE — Telephone Encounter (Signed)
For now just avoid the following: shellfish, pork, Malaw[place]urkey, chicken, beef and lamb.  Continue nexium 40mg  daily.  Continue budesonide 0.5mg  (mix 2 vials with 10 packets of splenda or apple sauce to clear a slurry) and take twice a day. NOTHING to eat or drink for 30 minutes afterwards

## 2018-01-22 NOTE — Telephone Encounter (Signed)
I spoke with the office again. Dr. Luisa HartPatrick is out of the office until next week. The receptionist is going to have him call back with the numbers needed.

## 2018-01-22 NOTE — Telephone Encounter (Signed)
Called patient. No answer and unable to leave a message.  

## 2018-01-22 NOTE — Telephone Encounter (Signed)
Patient wants to know if he should avoid all dairy products due to the allergy to meat

## 2018-01-22 NOTE — Telephone Encounter (Signed)
Patient called stating he has questions about his lab results. Please advise

## 2018-01-23 NOTE — Telephone Encounter (Signed)
Spoke with pathologist. Eos up to 27/hpf. They will fax report to our office.

## 2018-02-12 ENCOUNTER — Ambulatory Visit: Payer: BLUE CROSS/BLUE SHIELD | Admitting: Gastroenterology

## 2018-03-15 ENCOUNTER — Ambulatory Visit (INDEPENDENT_AMBULATORY_CARE_PROVIDER_SITE_OTHER): Payer: BLUE CROSS/BLUE SHIELD | Admitting: Gastroenterology

## 2018-03-15 ENCOUNTER — Encounter: Payer: Self-pay | Admitting: Gastroenterology

## 2018-03-15 VITALS — BP 110/70 | HR 74 | Wt 166.6 lb

## 2018-03-15 DIAGNOSIS — K200 Eosinophilic esophagitis: Secondary | ICD-10-CM | POA: Diagnosis not present

## 2018-03-15 DIAGNOSIS — R142 Eructation: Secondary | ICD-10-CM | POA: Diagnosis not present

## 2018-03-15 NOTE — Progress Notes (Signed)
HPI :  33 year old male here for a follow-up visit. I previously saw him in the clinic in September of this past year for dysphagia. He underwent endoscopy on October 1 and was noted to have eosinophilic esophagitis, with a distal esophageal stricture which was dilated to 17 mm. He reported benefit of his dysphagia after the procedure. He was told to continue Nexium 40 mg a day and I recommended adding Flovent 220 g twice a day, and referring him to an allergist after biopsies confirmed eosinophilic esophagitis. He was unable to afford the Flovent never took it. He was seen by Dr. Selena BattenKim of allergy in November. Food allergy testing was positive to shellfish, pork, [place]urkey, chicken, beef, and lamb. He was counseled to avoid all these foods and he states he has been doing very good job of doing that. He is eating a vegan diet. They have tried to order him budesonide slurries for his EOE, he also said he could not afford that some never picked it up.  Overall since avoiding his known food allergens and taking the Nexium, he's had no dysphagia. His symptoms have resolved. He does have some increased belching and bloating that bothers him at times. He denies any pyrosis.  EGD 11/27/2017 - GEJ stricture, dilated to 17mm, changes c/w EoE - path c/w EoE    Past Medical History:  Diagnosis Date  . Depression   . Dysphagia   . Eosinophilic esophagitis      Past Surgical History:  Procedure Laterality Date  . VASECTOMY     Family History  Problem Relation Age of Onset  . Diabetes Mother   . Heart disease Mother   . Hypertension Mother   . Diabetes Maternal Grandmother   . Heart disease Maternal Grandmother   . Stroke Maternal Grandmother   . Hypertension Paternal Grandmother   . Colon cancer Neg Hx   . Esophageal cancer Neg Hx   . Rectal cancer Neg Hx   . Stomach cancer Neg Hx    Social History   Tobacco Use  . Smoking status: Never Smoker  . Smokeless tobacco: Never Used  Substance Use  Topics  . Alcohol use: Yes    Comment: occ  . Drug use: Never   Current Outpatient Medications  Medication Sig Dispense Refill  . budesonide (PULMICORT) 0.5 MG/2ML nebulizer solution Mix 2 vials with 10 pkts splenda OR apple sauce twice a day. NOTHING to eat or drink for 30 min afterwards. 120 mL 5  . esomeprazole (NEXIUM) 40 MG capsule Take by mouth.    Marland Kitchen. ibuprofen (ADVIL,MOTRIN) 200 MG tablet Take 200 mg by mouth every 6 (six) hours as needed.    . vitamin C (ASCORBIC ACID) 500 MG tablet Take 500 mg by mouth daily.     No current facility-administered medications for this visit.    No Known Allergies   Review of Systems: All systems reviewed and negative except where noted in HPI.    No results found.  Physical Exam: BP 110/70   Pulse 74   Wt 166 lb 9.6 oz (75.6 kg)   BMI 23.90 kg/m  Constitutional: Pleasant,well-developed, male in no acute distress. HEENT: Normocephalic and atraumatic. Conjunctivae are normal. No scleral icterus. Neck supple.  Cardiovascular: Normal rate, regular rhythm.  Pulmonary/chest: Effort normal and breath sounds normal. No wheezing, rales or rhonchi. Abdominal: Soft, nondistended, nontender.   No hepatomegaly. Extremities: no edema Lymphadenopathy: No cervical adenopathy noted. Neurological: Alert and oriented to person place and time. Skin:  Skin is warm and dry. No rashes noted. Psychiatric: Normal mood and affect. Behavior is normal.   ASSESSMENT AND PLAN: 33 year old male here for reassessment of the following issues:  Eosinophilic esophagitis - associated with distal esophageal stricture status post dilation. Following dilation and on an elimination diet for known food allergens, his symptoms have completely resolved. He states he could not afford either the Flovent or topical budesonide, however I don't think he needs it at this time given his symptoms have resolved. We discussed EOE in general and natural history. He is clinically feeling  well right now without any symptoms, I would recommend he discontinue his diet and avoid known allergens. He can try coming off the Nexium and see how he does. If he notes recurrence of dysphagia in the future despite maintaining his diet, asked him to let me know, in that setting we will repeat an endoscopy and assess for active EOE. If he has active EOE despite maintaining a restricted diet to known food allergens, there may be other foods he is allergic to that have not been tested for, and would use the steroids in that setting. He agreed. Otherwise f/u in one year.   Belching - it's possible he is having increased gas / belching and bloating from change in diet to vegan. He can use some Gas-X as needed, provided handout on a low FODMAP diet to see if it can help decrease this. He can contact me with further questions if needed. Reassured him.   Ileene Patrick, MD [facility]

## 2018-03-15 NOTE — Patient Instructions (Signed)
Normal BMI (Body Mass Index- based on height and weight) is between 19 and 25. Your BMI today is Body mass index is 23.9 kg/m. Marland Kitchen Please consider follow up  regarding your BMI with your Primary Care Provider.  We are giving you a Low FOD-Map diet to follow.  You can purchase Gas-X over the counter and use as needed.  Thank you for entrusting me with your care and for choosing [facility], Dr. Ileene Patrick

## 2018-04-10 ENCOUNTER — Ambulatory Visit (INDEPENDENT_AMBULATORY_CARE_PROVIDER_SITE_OTHER): Payer: BLUE CROSS/BLUE SHIELD | Admitting: Allergy

## 2018-04-10 ENCOUNTER — Encounter: Payer: Self-pay | Admitting: Allergy

## 2018-04-10 VITALS — BP 132/92 | HR 80 | Temp 98.3°F | Resp 20 | Ht 69.2 in | Wt 162.0 lb

## 2018-04-10 DIAGNOSIS — R0602 Shortness of breath: Secondary | ICD-10-CM

## 2018-04-10 DIAGNOSIS — J3089 Other allergic rhinitis: Secondary | ICD-10-CM

## 2018-04-10 DIAGNOSIS — T781XXD Other adverse food reactions, not elsewhere classified, subsequent encounter: Secondary | ICD-10-CM

## 2018-04-10 DIAGNOSIS — K200 Eosinophilic esophagitis: Secondary | ICD-10-CM

## 2018-04-10 NOTE — Patient Instructions (Addendum)
Eosinophilic esophagitis  Continue to avoid shellfish, pork, [place], chicken, beef and lamb.  May try venison at home. Monitor symptoms.   Shortness of breath  Monitor symptoms.   Other allergic rhinitis  May use over the counter antihistamines such as Zyrtec (cetirizine), Claritin (loratadine), Allegra (fexofenadine), or Xyzal (levocetirizine) daily as needed.  Follow up in 6 months and we will repeat skin testing.

## 2018-04-10 NOTE — Assessment & Plan Note (Signed)
Past history - SOB for the past few months. No history of asthma. 2019 normal spirometry. Interim history - noticed anxiety seems to trigger these episodes.   Monitor symptoms.  If symptoms worsen then will consider adding inhaler.

## 2018-04-10 NOTE — Assessment & Plan Note (Signed)
Past history - Issues with swallowing food for the past few years and worse in the last 6 months.  No specific food triggers noted. EGD in October 2019 showed elevated eosinophils and required dilatation - in process of obtaining records regarding exact number of eosinophils from pathology.  Did not start Flovent due to cost. 2019 skin testing was positive to shellfish, pork, [place], chicken, beef and lamb. Interim history - avoiding below foods and no issues. Stopped Nexium and no worsening symptoms. Did not start budesonide due to cost.   Continue to avoid shellfish, pork, [place], chicken, beef and lamb.  May try venison at home. Monitor symptoms.   Lost some weight initially but now has been stable in the 160s lbs.   Will re-evaluate above foods at next visit.

## 2018-04-10 NOTE — Progress Notes (Signed)
Follow Up Note  RE: Dewaine CongerRyan A Wymore MRN: [id number] DOB: 04/13/1985 Date of Office Visit: 04/10/2018  Referring provider: Magdalene RiverWiseman, Brittany D, PA* Primary care provider: Magdalene RiverWiseman, Brittany D, PA-C  Chief Complaint: Follow-up  History of Present Illness: I had the pleasure of seeing Ester RinkRyan Sharron for a follow up visit at the [facility] on 04/10/2018. He is a 33 y.o. male, who is being followed for eosinophilic esophagitis, shortness of breath, adverse food reaction, allergic rhinitis. Today he is here for regular follow up visit. His previous allergy office visit was on 01/07/2018 with Dr. Selena BattenKim.   Eosinophilic esophagitis Stopped Nexium in January due to belching and indigestion which helped. Currently avoiding shellfish, pork, [place], chicken, beef and lamb. Mainly on a plant based diet. No issues with swallowing foods.   Shortness of breath Improved and maybe anxiety related.   Other allergic rhinitis Minimal symptoms and not taking any medications.   Assessment and Plan: Alycia RossettiRyan is a 33 y.o. male with: Eosinophilic esophagitis Past history - Issues with swallowing food for the past few years and worse in the last 6 months.  No specific food triggers noted. EGD in October 2019 showed elevated eosinophils and required dilatation - in process of obtaining records regarding exact number of eosinophils from pathology.  Did not start Flovent due to cost. 2019 skin testing was positive to shellfish, pork, [place], chicken, beef and lamb. Interim history - avoiding below foods and no issues. Stopped Nexium and no worsening symptoms. Did not start budesonide due to cost.   Continue to avoid shellfish, pork, [place], chicken, beef and lamb.  May try venison at home. Monitor symptoms.   Lost some weight initially but now has been stable in the 160s lbs.   Will re-evaluate above foods at next visit.   Shortness of breath Past history - SOB for the past few months. No history  of asthma. 2019 normal spirometry. Interim history - noticed anxiety seems to trigger these episodes.   Monitor symptoms.  If symptoms worsen then will consider adding inhaler.  Other allergic rhinitis Past history - Rhinitis symptoms for the past 2 years mainly during the fall.  Zyrtec with good benefit. Interim history - Asymptomatic with no medications.   May use over the counter antihistamines such as Zyrtec (cetirizine), Claritin (loratadine), Allegra (fexofenadine), or Xyzal (levocetirizine) daily as needed.  Return in about 6 months (around 10/09/2018) for Skin testing.  Diagnostics: None.  Medication List:  Current Outpatient Medications  Medication Sig Dispense Refill  . ibuprofen (ADVIL,MOTRIN) 200 MG tablet Take 200 mg by mouth every 6 (six) hours as needed.    . Multiple Vitamin (MULTIVITAMIN) capsule Take 1 capsule by mouth daily.    . vitamin C (ASCORBIC ACID) 500 MG tablet Take 500 mg by mouth daily.     No current facility-administered medications for this visit.    Allergies: No Known Allergies I reviewed his past medical history, social history, family history, and environmental history and no significant changes have been reported from previous visit on 01/07/2018.  Review of Systems  Constitutional: Negative for appetite change, chills, fever and unexpected weight change.  HENT: Negative for congestion and rhinorrhea.   Eyes: Negative for itching.  Respiratory: Positive for shortness of breath. Negative for cough, chest tightness and wheezing.   Cardiovascular: Negative for chest pain.  Gastrointestinal: Negative for abdominal pain.  Genitourinary: Negative for difficulty urinating.  Skin: Negative for rash.  Neurological: Negative for headaches.  Objective: BP (!) 132/92 (BP Location: Left Arm, Patient Position: Sitting, Cuff Size: Normal)   Pulse 80   Temp 98.3 F (36.8 C) (Oral)   Resp 20   Ht 5' 9.2" (1.758 m)   Wt 162 lb (73.5 kg)   SpO2 96%    BMI 23.79 kg/m  Body mass index is 23.79 kg/m. Physical Exam  Constitutional: He is oriented to person, place, and time. He appears well-developed and well-nourished.  HENT:  Head: Normocephalic and atraumatic.  Right Ear: External ear normal.  Left Ear: External ear normal.  Nose: Nose normal.  Mouth/Throat: Oropharynx is clear and moist.  Eyes: Conjunctivae and EOM are normal.  Neck: Neck supple.  Cardiovascular: Normal rate, regular rhythm and normal heart sounds. Exam reveals no gallop and no friction rub.  No murmur heard. Pulmonary/Chest: Effort normal and breath sounds normal. He has no wheezes. He has no rales.  Abdominal: Soft.  Neurological: He is alert and oriented to person, place, and time.  Skin: Skin is warm. No rash noted.  Psychiatric: He has a normal mood and affect. His behavior is normal.  Nursing note and vitals reviewed.  Previous notes and tests were reviewed. The plan was reviewed with the patient/family, and all questions/concerned were addressed.  It was my pleasure to see Keatan today and participate in his care. Please feel free to contact me with any questions or concerns.  Sincerely,  Wyline Mood, DO Allergy & Immunology  [facility] office: [phone] [facility] office: [phone]

## 2018-04-10 NOTE — Assessment & Plan Note (Signed)
Past history - Rhinitis symptoms for the past 2 years mainly during the fall.  Zyrtec with good benefit. Interim history - Asymptomatic with no medications.   May use over the counter antihistamines such as Zyrtec (cetirizine), Claritin (loratadine), Allegra (fexofenadine), or Xyzal (levocetirizine) daily as needed. 

## 2018-10-09 ENCOUNTER — Ambulatory Visit: Payer: BLUE CROSS/BLUE SHIELD | Admitting: Allergy

## 2018-10-30 ENCOUNTER — Encounter: Payer: Self-pay | Admitting: Allergy

## 2018-10-30 ENCOUNTER — Ambulatory Visit (INDEPENDENT_AMBULATORY_CARE_PROVIDER_SITE_OTHER): Payer: BC Managed Care – PPO | Admitting: Allergy

## 2018-10-30 ENCOUNTER — Other Ambulatory Visit: Payer: Self-pay

## 2018-10-30 VITALS — BP 122/78 | HR 74 | Temp 98.3°F | Resp 16 | Ht 70.0 in | Wt 160.0 lb

## 2018-10-30 DIAGNOSIS — J3089 Other allergic rhinitis: Secondary | ICD-10-CM | POA: Diagnosis not present

## 2018-10-30 DIAGNOSIS — T781XXD Other adverse food reactions, not elsewhere classified, subsequent encounter: Secondary | ICD-10-CM | POA: Diagnosis not present

## 2018-10-30 DIAGNOSIS — K200 Eosinophilic esophagitis: Secondary | ICD-10-CM | POA: Diagnosis not present

## 2018-10-30 NOTE — Assessment & Plan Note (Signed)
Past history - Issues with swallowing food for the past few years and worse in the last 6 months.  No specific food triggers noted. EGD in October 2019 showed elevated eosinophils and required dilatation - 27 eos/hpf.  Did not start Flovent or budesonide due to cost. 2019 skin testing was positive to shellfish, pork, [place], chicken, beef and lamb. Interim history - avoiding below foods and no GI issues. Did not try venison at home.   Today's skin testing was positive to shellfish, [place] and chicken. Borderline positive to pork. Negative to beef and lamb.   Continue to avoid shellfish, pork, [place], chicken, beef andlamb until you have your GI appointment.  Then let me know if they decide to do another EGD or not. Discussed that symptomatology alone does not necessarily mean that the eosinophils decreased.   Plan is to reintroduce beef and lamb first back into diet, followed by pork after GI appointment.

## 2018-10-30 NOTE — Patient Instructions (Addendum)
Eosinophilic esophagitis Past history - 2019 skin testing was positive to shellfish, pork, [place], chicken, beef and lamb.  Today's skin testing was positive to shellfish, [place] and chicken. Borderline positive to pork. Negative to beef and lamb.   Continue to avoid shellfish, pork, [place], chicken, beef andlamb until you have your GI appointment.  Then let me know if they decide to do another EGD or not.   Will reintroduce beef and lamb first back into your diet, followed by pork after your GI appointment.   Rhinitis   May use over the counter antihistamines such as Zyrtec (cetirizine), Claritin (loratadine), Allegra (fexofenadine), or Xyzal (levocetirizine) daily as needed.  Follow up in 1 year or sooner if needed.

## 2018-10-30 NOTE — Assessment & Plan Note (Signed)
Past history - Rhinitis symptoms for the past 2 years mainly during the fall.  Zyrtec with good benefit. Interim history - Asymptomatic with no medications.   May use over the counter antihistamines such as Zyrtec (cetirizine), Claritin (loratadine), Allegra (fexofenadine), or Xyzal (levocetirizine) daily as needed. 

## 2018-10-30 NOTE — Progress Notes (Signed)
Follow Up Note  RE: Curtis Grimes MRN: [id number] DOB: 11/10/1985 Date of Office Visit: 10/30/2018  Referring provider: Magdalene RiverWiseman, Brittany D, PA* Primary care provider: Magdalene RiverWiseman, Brittany D, PA-C  Chief Complaint: EOE  History of Present Illness: I had the pleasure of seeing Curtis Grimes for a follow up visit at the [facility] on 10/30/2018. He is a 33 y.o. male, who is being followed for eosinophilic esophagitis, shortness of breath and allergic rhinitis. Today he is here for skin testing. His previous allergy office visit was on 04/10/2018 with Dr. Selena BattenKim.   Eosinophilic esophagitis Currently avoiding shellfish, pork, [place], chicken, beef and lamb. Denies any GI issues. No repeat EGD since October 2019.  He does have a follow up with GI scheduled in the fall. He did not try any venison at home. Weight has been stable since the spring.   Shortness of breath No additional episodes.   Other allergic rhinitis No symptoms with no medications.   Assessment and Plan: Curtis Grimes is a 33 y.o. male with: Eosinophilic esophagitis Past history - Issues with swallowing food for the past few years and worse in the last 6 months.  No specific food triggers noted. EGD in October 2019 showed elevated eosinophils and required dilatation - 27 eos/hpf.  Did not start Flovent or budesonide due to cost. 2019 skin testing was positive to shellfish, pork, [place], chicken, beef and lamb. Interim history - avoiding below foods and no GI issues. Did not try venison at home.   Today's skin testing was positive to shellfish, [place] and chicken. Borderline positive to pork. Negative to beef and lamb.   Continue to avoid shellfish, pork, [place], chicken, beef andlamb until you have your GI appointment.  Then let me know if they decide to do another EGD or not. Discussed that symptomatology alone does not necessarily mean that the eosinophils decreased.   Plan is to reintroduce beef and lamb  first back into diet, followed by pork after GI appointment.   Other allergic rhinitis Past history - Rhinitis symptoms for the past 2 years mainly during the fall.  Zyrtec with good benefit. Interim history - Asymptomatic with no medications.   May use over the counter antihistamines such as Zyrtec (cetirizine), Claritin (loratadine), Allegra (fexofenadine), or Xyzal (levocetirizine) daily as needed.  Return in about 1 year (around 10/30/2019).  Diagnostics: Skin Testing: Select foods. Positive test to: shellfish, [place] and chicken. Borderline positive to pork. Negative to beef and lamb. Results discussed with patient/family. Food Adult Perc - 10/30/18 1400    Time Antigen Placed  1426    Allergen Manufacturer  Waynette ButteryGreer    Location  Arm    Number of allergen test  9     Control-buffer 50% Glycerol  Negative    Control-Histamine 1 mg/ml  2+    8. Shellfish Mix  --   +/-   25. Shrimp  --   4 x 3   26. Crab  --   4 x 3   27. Lobster  --   5 x 4   37. Pork  --   2 x 2   38. [place] Meat  --   3 x 3   39. Chicken Meat  --   3 x 3   40. Beef  Negative    41. Lamb  Negative       Medication List:  Current Outpatient Medications  Medication Sig Dispense Refill  . ibuprofen (ADVIL,MOTRIN) 200 MG  tablet Take 200 mg by mouth every 6 (six) hours as needed.    . Multiple Vitamin (MULTIVITAMIN) capsule Take 1 capsule by mouth daily.    . vitamin C (ASCORBIC ACID) 500 MG tablet Take 500 mg by mouth daily.     No current facility-administered medications for this visit.    Allergies: No Known Allergies I reviewed his past medical history, social history, family history, and environmental history and no significant changes have been reported from previous visit on 04/10/2018.  Review of Systems  Constitutional: Negative for appetite change, chills, fever and unexpected weight change.  HENT: Negative for congestion and rhinorrhea.   Eyes: Negative for itching.  Respiratory: Negative  for cough, chest tightness, shortness of breath and wheezing.   Cardiovascular: Negative for chest pain.  Gastrointestinal: Negative for abdominal pain.  Genitourinary: Negative for difficulty urinating.  Skin: Negative for rash.  Allergic/Immunologic: Positive for food allergies.  Neurological: Negative for headaches.   Objective: BP 122/78 (BP Location: Left Arm, Patient Position: Sitting, Cuff Size: Normal)   Pulse 74   Temp 98.3 F (36.8 C) (Temporal)   Resp 16   Ht 5\' 10"  (1.778 m)   Wt 160 lb (72.6 kg)   SpO2 97%   BMI 22.96 kg/m  Body mass index is 22.96 kg/m. Physical Exam  Constitutional: He is oriented to person, place, and time. He appears well-developed and well-nourished.  HENT:  Head: Normocephalic and atraumatic.  Right Ear: External ear normal.  Left Ear: External ear normal.  Nose: Nose normal.  Mouth/Throat: Oropharynx is clear and moist.  Eyes: Conjunctivae and EOM are normal.  Neck: Neck supple.  Cardiovascular: Normal rate, regular rhythm and normal heart sounds. Exam reveals no gallop and no friction rub.  No murmur heard. Pulmonary/Chest: Effort normal and breath sounds normal. He has no wheezes. He has no rales.  Abdominal: Soft.  Neurological: He is alert and oriented to person, place, and time.  Skin: Skin is warm. No rash noted.  Psychiatric: He has a normal mood and affect. His behavior is normal.  Nursing note and vitals reviewed.  Previous notes and tests were reviewed. The plan was reviewed with the patient/family, and all questions/concerned were addressed.  It was my pleasure to see Curtis Grimes today and participate in his care. Please feel free to contact me with any questions or concerns.  Sincerely,  Rexene Alberts, DO Allergy & Immunology  [facility] office: [phone] [facility] office: [place] office: [phone]

## 2018-12-31 ENCOUNTER — Encounter: Payer: Self-pay | Admitting: Gastroenterology

## 2018-12-31 ENCOUNTER — Ambulatory Visit (INDEPENDENT_AMBULATORY_CARE_PROVIDER_SITE_OTHER): Payer: BC Managed Care – PPO | Admitting: Physician Assistant

## 2018-12-31 ENCOUNTER — Encounter: Payer: Self-pay | Admitting: Physician Assistant

## 2018-12-31 ENCOUNTER — Other Ambulatory Visit: Payer: Self-pay

## 2018-12-31 VITALS — BP 120/78 | HR 72 | Temp 97.8°F | Ht 70.0 in | Wt 158.6 lb

## 2018-12-31 DIAGNOSIS — K200 Eosinophilic esophagitis: Secondary | ICD-10-CM

## 2018-12-31 DIAGNOSIS — R1310 Dysphagia, unspecified: Secondary | ICD-10-CM | POA: Diagnosis not present

## 2018-12-31 DIAGNOSIS — Z1159 Encounter for screening for other viral diseases: Secondary | ICD-10-CM | POA: Diagnosis not present

## 2018-12-31 NOTE — Progress Notes (Signed)
Agree with assessment and plan as outlined. He needs to continue PPI indefinitely given recurrent stricturing and would benefit from topical steroid if he can afford it. We will plan for EGD and dilation and will discuss plan with him pending that result. Otherwise continue to avoid known food allergens.

## 2018-12-31 NOTE — Patient Instructions (Signed)
If you are age 33 or older, your body mass index should be between 23-30. Your Body mass index is 22.76 kg/m. If this is out of the aforementioned range listed, please consider follow up with your Primary Care Provider.  If you are age 59 or younger, your body mass index should be between 19-25. Your Body mass index is 22.76 kg/m. If this is out of the aformentioned range listed, please consider follow up with your Primary Care Provider.   You have been scheduled for an endoscopy. Please follow written instructions given to you at your visit today. If you use inhalers (even only as needed), please bring them with you on the day of your procedure.   Thank you for choosing me and [facility]  Ellouise Newer, PA-C

## 2018-12-31 NOTE — Progress Notes (Signed)
Chief Complaint: Dysphagia  HPI:    Curtis Grimes is a 33 year old male with a past medical history as listed below, known to Dr. Adela Lank for his eosinophilic esophagitis, who was referred to me by Benjiman Core D, PA* for a complaint of dysphagia.      03/15/2018 patient seen by Dr. Adela Lank.  At that time as discussed he had been seen a year ago for dysphagia.  Underwent EGD November 27, 2017 and was noted to have eosinophilic esophagitis with a distal esophageal stricture which was dilated to 17 mm.  He reported benefit of his is foggy after the procedure.  He was told to continue 40 mg of Nexium a day and recommended adding Flovent 220 mcg twice a day.  He was also referred to an allergist.  Unable to afford Flovent.  Saw allergist in November.  Told certain foods to avoid.  Never picked up budesonide slurry's for his EOE either.  He was told us that he ever had repeat of symptoms he would need repeat EGD and possible discussion about Flovent/budesonide.    10/30/2018 patient saw allergist.  At that time, he had continue to avoid shellfish, pork, [place], chicken, beef and Lamb.  The plan was to reintroduce beef and lamb first back in the diet followed by pork after his GI appointment.    Today, the patient tells me that he stopped his Nexium after seeing Dr. Adela Lank in January and had been doing well until about a week ago.  Over the past week he has had an alarming increase in dysphagia symptoms telling me that now even some liquids seem to get stuck in his throat.  He tried to take an Ibuprofen tablet yesterday which just would not go down at all.  He has been strict with his diet given by the allergist.  He restarted his Nexium a week ago.  He is eager to have an endoscopy to get this fixed again.    Denies fever, chills, abdominal pain or change in bowel habits.     Past Medical History:  Diagnosis Date  . Depression   . Dysphagia   . Eosinophilic esophagitis     Past Surgical  History:  Procedure Laterality Date  . VASECTOMY      Current Outpatient Medications  Medication Sig Dispense Refill  . ibuprofen (ADVIL,MOTRIN) 200 MG tablet Take 200 mg by mouth every 6 (six) hours as needed.    . Multiple Vitamin (MULTIVITAMIN) capsule Take 1 capsule by mouth daily.    . vitamin C (ASCORBIC ACID) 500 MG tablet Take 500 mg by mouth daily.     No current facility-administered medications for this visit.     Allergies as of 12/31/2018  . (No Known Allergies)    Family History  Problem Relation Age of Onset  . Diabetes Mother   . Heart disease Mother   . Hypertension Mother   . Diabetes Maternal Grandmother   . Heart disease Maternal Grandmother   . Stroke Maternal Grandmother   . Hypertension Paternal Grandmother   . Colon cancer Neg Hx   . Esophageal cancer Neg Hx   . Rectal cancer Neg Hx   . Stomach cancer Neg Hx     Social History   Socioeconomic History  . Marital status: Married    Spouse name: Not on file  . Number of children: 2  . Years of education: Not on file  . Highest education level: Not on file  Occupational History  .  Not on file  Social Needs  . Financial resource strain: Not on file  . Food insecurity    Worry: Not on file    Inability: Not on file  . Transportation needs    Medical: Not on file    Non-medical: Not on file  Tobacco Use  . Smoking status: Never Smoker  . Smokeless tobacco: Never Used  Substance and Sexual Activity  . Alcohol use: Yes    Comment: occ  . Drug use: Never  . Sexual activity: Yes  Lifestyle  . Physical activity    Days per week: Not on file    Minutes per session: Not on file  . Stress: Not on file  Relationships  . Social Herbalist on phone: Not on file    Gets together: Not on file    Attends religious service: Not on file    Active member of club or organization: Not on file    Attends meetings of clubs or organizations: Not on file    Relationship status: Not on file   . Intimate partner violence    Fear of current or ex partner: Not on file    Emotionally abused: Not on file    Physically abused: Not on file    Forced sexual activity: Not on file  Other Topics Concern  . Not on file  Social History Narrative  . Not on file    Review of Systems:    Constitutional: No weight loss, fever or chills Cardiovascular: No chest pain  Respiratory: No SOB  Gastrointestinal: See HPI and otherwise negative   Physical Exam:  Vital signs: BP 120/78   Pulse 72   Temp 97.8 F (36.6 C)   Ht 5\' 10"  (1.778 m)   Wt 158 lb 9.6 oz (71.9 kg)   BMI 22.76 kg/m   Constitutional:   Pleasant Caucasian male appears to be in NAD, Well developed, Well nourished, alert and cooperative Respiratory: Respirations even and unlabored. Lungs clear to auscultation bilaterally.   No wheezes, crackles, or rhonchi.  Cardiovascular: Normal S1, S2. No MRG. Regular rate and rhythm. No peripheral edema, cyanosis or pallor.  Gastrointestinal:  Soft, nondistended, nontender. No rebound or guarding. Normal bowel sounds. No appreciable masses or hepatomegaly. Rectal:  Not performed.  Psychiatric: Demonstrates good judgement and reason without abnormal affect or behaviors.  No recent labs or imaging.  Assessment: 1.  Dysphagia: Increased over the past week, prior to this no symptoms, has been avoiding all of his trigger foods ; likely eosinophilic esophagitis with stricture  2.  History of eosinophilic esophagitis  Plan: 1.  Scheduled patient for repeat EGD with dilation in the [place] with Dr. Havery Moros.  Did discuss risks, benefits, limitations and alternatives and the patient agrees to proceed. 2.  Reviewed antidysphagia measures including taking small bites, chewing well and avoiding distraction while eating. 3.  Patient to continue his Nexium 4.  Discussed Budesonide again with the patient, it may be that he needs to buy this. 5.  Patient to follow in clinic per recommendations from  Dr. Havery Moros after time of procedure.  Ellouise Newer, PA-C [facility] 12/31/2018, 10:30 AM  Cc: Tenna Delaine D, PA*

## 2019-01-01 LAB — SARS CORONAVIRUS 2 (TAT 6-24 HRS): SARS Coronavirus 2: NEGATIVE

## 2019-01-02 ENCOUNTER — Ambulatory Visit (AMBULATORY_SURGERY_CENTER): Payer: BC Managed Care – PPO | Admitting: Gastroenterology

## 2019-01-02 ENCOUNTER — Other Ambulatory Visit: Payer: Self-pay

## 2019-01-02 ENCOUNTER — Encounter: Payer: Self-pay | Admitting: Gastroenterology

## 2019-01-02 VITALS — BP 126/76 | HR 78 | Temp 98.3°F | Resp 17 | Ht 70.0 in | Wt 158.0 lb

## 2019-01-02 DIAGNOSIS — K222 Esophageal obstruction: Secondary | ICD-10-CM

## 2019-01-02 DIAGNOSIS — K200 Eosinophilic esophagitis: Secondary | ICD-10-CM

## 2019-01-02 DIAGNOSIS — R1310 Dysphagia, unspecified: Secondary | ICD-10-CM | POA: Diagnosis not present

## 2019-01-02 MED ORDER — OMEPRAZOLE 40 MG PO CPDR: 40.0000 mg | DELAYED_RELEASE_CAPSULE | Freq: Every day | ORAL | 3 refills | Status: DC

## 2019-01-02 MED ORDER — SODIUM CHLORIDE 0.9 % IV SOLN: 500.0000 mL | Freq: Once | INTRAVENOUS | Status: DC

## 2019-01-02 NOTE — Progress Notes (Signed)
VS by [place]. Temp by LC 

## 2019-01-02 NOTE — Progress Notes (Signed)
Called to room to assist during endoscopic procedure.  Patient ID and intended procedure confirmed with present staff. Received instructions for my participation in the procedure from the performing physician.  

## 2019-01-02 NOTE — Op Note (Signed)
[facility] Patient Name: Lemuel Boodram Procedure Date: 01/02/2019 4:09 PM MRN: [id number] Endoscopist: Viviann Spare P. Adela Lank , MD Age: 33 Referring MD:  Date of Birth: [birth year] Gender: Male Account #: [id number] Procedure:                Upper GI endoscopy Indications:              Dysphagia, For therapy of eosinophilic esophagitis                            - history of stricturing with dilation in 2019,                            multiple food allergies, avoiding known allergens,                            could not get budesonide / flovent in the past, not                            on PPI currently Medicines:                Monitored Anesthesia Care Procedure:                Pre-Anesthesia Assessment:                           - Prior to the procedure, a History and Physical                            was performed, and patient medications and                            allergies were reviewed. The patient's tolerance of                            previous anesthesia was also reviewed. The risks                            and benefits of the procedure and the sedation                            options and risks were discussed with the patient.                            All questions were answered, and informed consent                            was obtained. Prior Anticoagulants: The patient has                            taken no previous anticoagulant or antiplatelet                            agents. ASA Grade Assessment: II - A patient with  mild systemic disease. After reviewing the risks                            and benefits, the patient was deemed in                            satisfactory condition to undergo the procedure.                           After obtaining informed consent, the endoscope was                            passed under direct vision. Throughout the                            procedure, the patient's blood  pressure, pulse, and                            oxygen saturations were monitored continuously. The                            Endoscope was introduced through the mouth, and                            advanced to the second part of duodenum. The upper                            GI endoscopy was accomplished without difficulty.                            The patient tolerated the procedure well. Scope In: Scope Out: Findings:                 Esophagogastric landmarks were identified: the                            Z-line was found at 41 cm, the gastroesophageal                            junction was found at 41 cm and the upper extent of                            the gastric folds was found at 41 cm from the                            incisors.                           One benign-appearing, intrinsic moderate stenosis                            was found 41 cm from the incisors. This stenosis  measured less than one cm (in length). The stenosis                            was traversed with the endoscope itself which very                            mildly dilated the stricture. A TTS dilator was                            passed through the scope. Dilation with a                            13.5-14.5-15.5 mm balloon dilator was performed to                            13.5 mm and then at 14.5 mm after which 2                            appropriate mucosal wrents were noted.                           Mucosal changes including longitudinal furrows were                            found in the middle third of the esophagus and in                            the lower third of the esophagus, consistent with                            active eosinophilic esophagitis.                           The exam of the esophagus was otherwise normal.                           The entire examined stomach was normal.                           The duodenal bulb and second portion of  the                            duodenum were normal. Complications:            No immediate complications. Estimated blood loss:                            Minimal. Estimated Blood Loss:     Estimated blood loss was minimal. Impression:               - Esophagogastric landmarks identified.                           - Benign-appearing esophageal stenosis at the GEJ.  Dilated to 14.83mm with a good result                           - Esophageal mucosal changes consistent with active                            eosinophilic esophagitis in the mid to distal                            esophagus.                           - Normal stomach.                           - Normal duodenal bulb and second portion of the                            duodenum. Recommendation:           - Patient has a contact number available for                            emergencies. The signs and symptoms of potential                            delayed complications were discussed with the                            patient. Return to normal activities tomorrow.                            Written discharge instructions were provided to the                            patient.                           - Post dilation diet                           - Continue present medications.                           - start PPI - previously on nexium  / day, can                            resume this or try another PPI, such as omeprazole                             once daily                           - await course post dilation, dysphagia may not be  completely relieved with this exam itself, may need                            further dilation and or more aggressive therapy for                            eosinophilic esophagitis (however was not able to                            obtain topical steroids when last attempted)                           - if dysphagia is  not relieved, contact me in the                            next week to schedule another dilation. See me in                            the office in 1 month for follow up Viviann Spare P. Armbruster, MD 01/02/2019 4:42:17 PM This report has been signed electronically.

## 2019-01-02 NOTE — Progress Notes (Signed)
Report to PACU, RN, vss, BBS= Clear.  

## 2019-01-02 NOTE — Patient Instructions (Addendum)
You may continue omeprazole if you need another medication.  Read the report the doctor gave you, and handouts given to you by your recovery room nurse.  YOU HAD AN ENDOSCOPIC PROCEDURE TODAY AT [facility]:   Refer to the procedure report that was given to you for any specific questions about what was found during the examination.  If the procedure report does not answer your questions, please call your gastroenterologist to clarify.  If you requested that your care partner not be given the details of your procedure findings, then the procedure report has been included in a sealed envelope for you to review at your convenience later.  YOU SHOULD EXPECT: Some feelings of bloating in the abdomen. Passage of more gas than usual.  Walking can help get rid of the air that was put into your GI tract during the procedure and reduce the bloating.   Please Note:  You might notice some irritation and congestion in your nose or some drainage.  This is from the oxygen used during your procedure.  There is no need for concern and it should clear up in a day or so.  SYMPTOMS TO REPORT IMMEDIATELY:    Following upper endoscopy (EGD)  Vomiting of blood or coffee ground material  New chest pain or pain under the shoulder blades  Painful or persistently difficult swallowing  New shortness of breath  Fever of 100F or higher  Black, tarry-looking stools  For urgent or emergent issues, a gastroenterologist can be reached at any hour by calling [phone].   DIET:  We do recommend nothing by mouth until 6:30pm. After that,you may have cleat liquids until 7:30pm.   Then, you may have a soft diet for the rest of the evening tonight. You may resume a regular diet tomorrow. Drink plenty of fluids but you should avoid alcoholic beverages for 24 hours.  ACTIVITY:  You should plan to take it easy for the rest of today and you should NOT DRIVE or use heavy machinery until tomorrow (because of the  sedation medicines used during the test).    FOLLOW UP: Our staff will call the number listed on your records 48-72 hours following your procedure to check on you and address any questions or concerns that you may have regarding the information given to you following your procedure. If we do not reach you, we will leave a message.  We will attempt to reach you two times.  During this call, we will ask if you have developed any symptoms of COVID 19. If you develop any symptoms (ie: fever, flu-like symptoms, shortness of breath, cough etc.) before then, please call [phone].  If you test positive for Covid 19 in the 2 weeks post procedure, please call and report this information to [place].    If any biopsies were taken you will be contacted by phone or by letter within the next 1-3 weeks.  Please call us at [phone] if you have not heard about the biopsies in 3 weeks.    SIGNATURES/CONFIDENTIALITY: You and/or your care partner have signed paperwork which will be entered into your electronic medical record.  These signatures attest to the fact that that the information above on your After Visit Summary has been reviewed and is understood.  Full responsibility of the confidentiality of this discharge information lies with you and/or your care-partner.

## 2019-01-06 ENCOUNTER — Telehealth: Payer: Self-pay | Admitting: *Deleted

## 2019-01-06 NOTE — Telephone Encounter (Signed)
  Follow up Call-  Call back number 01/02/2019 11/27/2017  Post procedure Call Back phone  # [phone] cell  Permission to leave phone message Yes Yes     Patient questions:  Do you have a fever, pain , or abdominal swelling? No. Pain Score  0 *  Have you tolerated food without any problems? No.  Have you been able to return to your normal activities? Yes.    Do you have any questions about your discharge instructions: Diet   No. Medications  No. Follow up visit  No.  Do you have questions or concerns about your Care? No.  Actions: * If pain score is 4 or above: No action needed, pain <4. 1. Have you developed a fever since your procedure? no  2.   Have you had an respiratory symptoms (SOB or cough) since your procedure?no**  3.   Have you tested positive for COVID 19 since your procedure no  4.   Have you had any family members/close contacts diagnosed with the COVID 19 since your procedure?  no   If yes to any of these questions please route to Joylene John, RN and Alphonsa Gin, Therapist, sports.

## 2019-06-22 IMAGING — DX DG CHEST 2V
2 series · 2 of 2 positions shown · non-contrast
Comparison: None.

CLINICAL DATA: Chest pain with difficulty catching breath for 4
days. Post endoscopy.

EXAM:
CHEST - 2 VIEW

[chest pa]
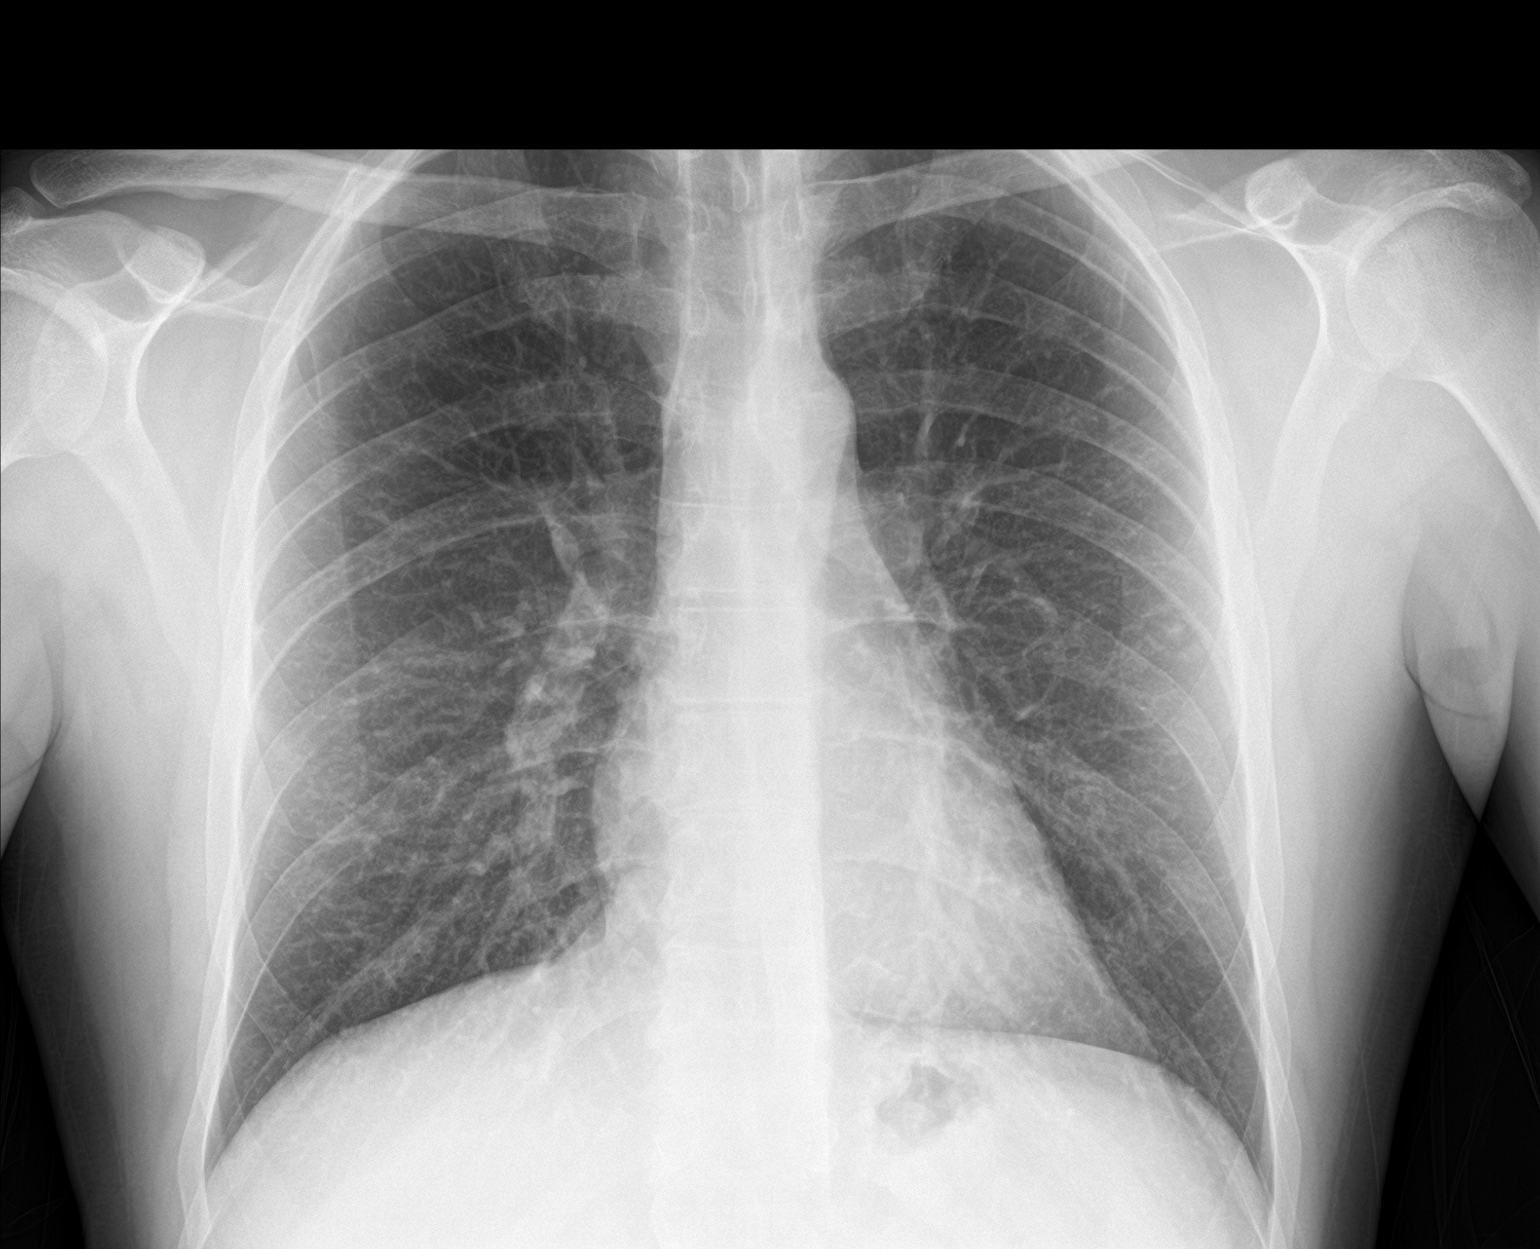

[chest lat]
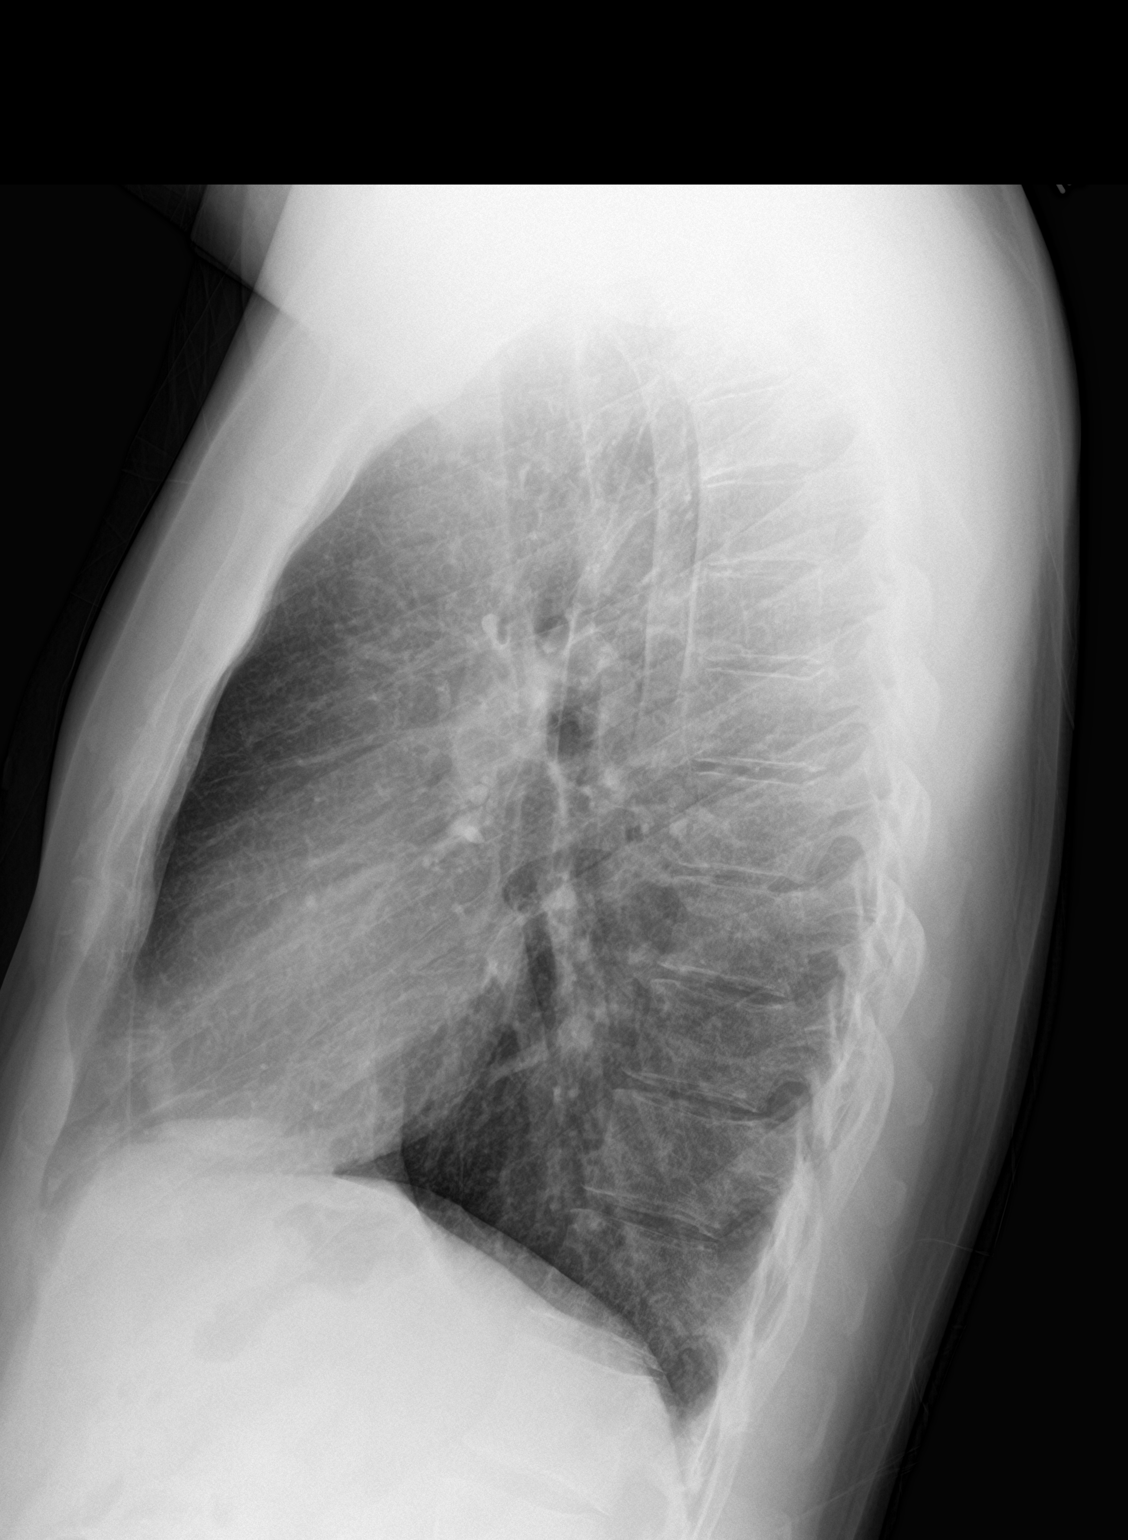

[2 of 2 positions shown; findings below may reference images not displayed]

FINDINGS: The heart size and mediastinal contours are normal. There is a
questionable small nodule peripherally in the mid left lung on the
frontal examination. This is not seen on the lateral view, and could
be due to an overlap of vascular and osseous structures. There is no
confluent airspace opacity, pleural effusion or pneumothorax. No
acute osseous findings are demonstrated. No free intraperitoneal
air.
IMPRESSION: No acute or suspicious findings. Questionable small nodule
peripherally in the left lung, of doubtful significance given the
patient's young age. Follow-up PA view in 1 months suggested.

## 2019-11-05 ENCOUNTER — Ambulatory Visit (INDEPENDENT_AMBULATORY_CARE_PROVIDER_SITE_OTHER): Payer: BC Managed Care – PPO | Admitting: Allergy

## 2019-11-05 ENCOUNTER — Encounter: Payer: Self-pay | Admitting: Allergy

## 2019-11-05 ENCOUNTER — Other Ambulatory Visit: Payer: Self-pay

## 2019-11-05 VITALS — BP 122/82 | HR 66 | Temp 98.1°F | Resp 16 | Ht 69.0 in | Wt 151.0 lb

## 2019-11-05 DIAGNOSIS — K200 Eosinophilic esophagitis: Secondary | ICD-10-CM | POA: Diagnosis not present

## 2019-11-05 DIAGNOSIS — J3089 Other allergic rhinitis: Secondary | ICD-10-CM | POA: Diagnosis not present

## 2019-11-05 DIAGNOSIS — T781XXD Other adverse food reactions, not elsewhere classified, subsequent encounter: Secondary | ICD-10-CM

## 2019-11-05 NOTE — Patient Instructions (Addendum)
Eosinophilic esophagitis  2019 skin testing was positive to shellfish, pork, [place], chicken, beef and lamb.  2020 skin testing was positive to shellfish, [place] and chicken. Borderline positive to pork. Negative to beef and lamb.   Today's skin testing was positive to shellfish, shrimp and lobster. Negative to crab, pork, [place], chicken, beef, lamb.   Continue to avoid shellfish.  Okay to reintroduce these foods back into the diet. Start with beef, pork and lamb. Then reintroduce chicken and [place].   Recommend doing one food at a time and consume a few times per week.  Monitor for symptoms  If you notice any symptoms then stop and eliminate from your diet.   Rhinitis   May use over the counter antihistamines such as Zyrtec (cetirizine), Claritin (loratadine), Allegra (fexofenadine), or Xyzal (levocetirizine) daily as needed.  Follow up in 1 year or sooner if needed.

## 2019-11-05 NOTE — Progress Notes (Signed)
Follow Up Note  RE: Curtis Grimes MRN: [id number] DOB: 1985-09-30 Date of Office Visit: 11/05/2019  Referring provider: Magdalene River, PA* Primary care provider: Magdalene River, PA-C  Chief Complaint: Allergies  History of Present Illness: I had the pleasure of seeing Curtis Grimes for a follow up visit at the [facility] on 11/05/2019. He is a 34 y.o. male, who is being followed for eoe, allergic rhinitis. His previous allergy office visit was on 10/30/2018 with Dr. Selena Batten. Today is a regular follow up visit.  Eosinophilic esophagitis Patient has been avoiding shellfish, pork, [place], chicken, beef and lamb.  He had EGD with dilatations in November 2020 and no issues since then.  Not sure about his eosinophil counts.  Denies any EoE symptoms. Takes omeprazole daily.   Other allergic rhinitis Asymptomatic with no medications.   Assessment and Plan: Curtis Grimes is a 34 y.o. male with: Eosinophilic esophagitis Past history - Issues with swallowing food for the past few years and worse in the last 6 months.  No specific food triggers noted. EGD in October 2019 showed elevated eosinophils and required dilatation - 27 eos/hpf.  Did not start Flovent or budesonide due to cost. 2019 skin testing was positive to shellfish, pork, [place], chicken, beef and lamb. 2020 skin testing was positive to shellfish, [place] and chicken. Borderline positive to pork. Negative to beef and lamb. Interim history - 1 EGD with dilatations in November 2020. Doing well with no symptoms and taking omeprazole 40mg  daily.  Today's skin testing was positive to shellfish, shrimp and lobster. Negative to crab, pork, , chicken, beef, lamb.   Continue to avoid shellfish.  Okay to reintroduce these foods back into the diet. Start with beef, pork and lamb. Then reintroduce chicken and [place].   Recommend doing one food at a time and consume a few times per week.  Monitor for symptoms  If you  notice any symptoms then stop and eliminate from your diet.  Continue omeprazole 40mg  daily.   Other allergic rhinitis Past history - Rhinitis symptoms for the past 2 years mainly during the fall.  Zyrtec with good benefit. Interim history - Asymptomatic with no medications.   May use over the counter antihistamines such as Zyrtec (cetirizine), Claritin (loratadine), Allegra (fexofenadine), or Xyzal (levocetirizine) daily as needed.  Return in about 1 year (around 11/04/2020).  Diagnostics: Skin Testing: Select foods. Positive to shellfish, shrimp and lobster. Negative to crab, pork, , chicken, beef, lamb.  Results discussed with patient/family.  Food Adult Perc - 11/05/19 1600    Time Antigen Placed 1627    Allergen Manufacturer [place]    Location Back    Number of allergen test 11     Control-buffer 50% Glycerol Negative    Control-Histamine 1 mg/ml 2+    8. Shellfish Mix --   +/-   25. Shrimp --   3x3   26. Crab Negative    27. Lobster Negative   5x3   37. Pork Negative    38. 01/05/20 Meat Negative    39. Chicken Meat Negative    40. Beef Negative    41. Lamb Negative           Medication List:  Current Outpatient Medications  Medication Sig Dispense Refill  . Multiple Vitamin (MULTIVITAMIN) capsule Take 1 capsule by mouth daily.    Waynette Buttery omeprazole (PRILOSEC) 40 MG capsule Take 1 capsule (40 mg total) by mouth daily. 90 capsule 3  .  vitamin C (ASCORBIC ACID) 500 MG tablet Take 500 mg by mouth daily.    Marland Kitchen ibuprofen (ADVIL,MOTRIN) 200 MG tablet Take 200 mg by mouth every 6 (six) hours as needed. (Patient not taking: Reported on 11/05/2019)     Current Facility-Administered Medications  Medication Dose Route Frequency Provider Last Rate Last Admin  . 0.9 %  sodium chloride infusion  500 mL Intravenous Once Armbruster, Willaim Rayas, MD       Allergies: No Known Allergies I reviewed his past medical history, social history, family history, and environmental history and no  significant changes have been reported from his previous visit.  Review of Systems  Constitutional: Negative for appetite change, chills, fever and unexpected weight change.  HENT: Negative for congestion and rhinorrhea.   Eyes: Negative for itching.  Respiratory: Negative for cough, chest tightness, shortness of breath and wheezing.   Cardiovascular: Negative for chest pain.  Gastrointestinal: Negative for abdominal pain.  Genitourinary: Negative for difficulty urinating.  Skin: Negative for rash.  Allergic/Immunologic: Positive for food allergies.  Neurological: Negative for headaches.   Objective: BP 122/82   Pulse 66   Temp 98.1 F (36.7 C) (Temporal)   Resp 16   Ht 5\' 9"  (1.753 m)   Wt 151 lb (68.5 kg)   SpO2 100%   BMI 22.30 kg/m  Body mass index is 22.3 kg/m. Physical Exam Vitals and nursing note reviewed.  Constitutional:      Appearance: He is well-developed.  HENT:     Head: Normocephalic and atraumatic.     Right Ear: External ear normal.     Left Ear: External ear normal.     Nose: Nose normal.     Mouth/Throat:     Mouth: Mucous membranes are moist.     Pharynx: Oropharynx is clear.  Eyes:     Conjunctiva/sclera: Conjunctivae normal.  Cardiovascular:     Rate and Rhythm: Normal rate and regular rhythm.     Heart sounds: Normal heart sounds. No murmur heard.  No friction rub. No gallop.   Pulmonary:     Effort: Pulmonary effort is normal.     Breath sounds: Normal breath sounds. No wheezing or rales.  Musculoskeletal:     Cervical back: Neck supple.  Skin:    General: Skin is warm.     Findings: No rash.  Neurological:     Mental Status: He is alert and oriented to person, place, and time.  Psychiatric:        Behavior: Behavior normal.    Previous notes and tests were reviewed. The plan was reviewed with the patient/family, and all questions/concerned were addressed.  It was my pleasure to see Curtis Grimes today and participate in his care. Please  feel free to contact me with any questions or concerns.  Sincerely,  Alycia Rossetti, DO Allergy & Immunology  [facility] office: [phone] [facility] [place] office: [phone] [place] office: [phone]

## 2019-11-05 NOTE — Assessment & Plan Note (Signed)
Past history - Issues with swallowing food for the past few years and worse in the last 6 months.  No specific food triggers noted. EGD in October 2019 showed elevated eosinophils and required dilatation - 27 eos/hpf.  Did not start Flovent or budesonide due to cost. 2019 skin testing was positive to shellfish, pork, [place], chicken, beef and lamb. 2020 skin testing was positive to shellfish, [place] and chicken. Borderline positive to pork. Negative to beef and lamb. Interim history - 1 EGD with dilatations in November 2020. Doing well with no symptoms and taking omeprazole 40mg  daily.  Today's skin testing was positive to shellfish, shrimp and lobster. Negative to crab, pork, , chicken, beef, lamb.   Continue to avoid shellfish.  Okay to reintroduce these foods back into the diet. Start with beef, pork and lamb. Then reintroduce chicken and [place].   Recommend doing one food at a time and consume a few times per week.  Monitor for symptoms  If you notice any symptoms then stop and eliminate from your diet.  Continue omeprazole 40mg  daily.

## 2019-11-05 NOTE — Assessment & Plan Note (Signed)
Past history - Rhinitis symptoms for the past 2 years mainly during the fall.  Zyrtec with good benefit. Interim history - Asymptomatic with no medications.   May use over the counter antihistamines such as Zyrtec (cetirizine), Claritin (loratadine), Allegra (fexofenadine), or Xyzal (levocetirizine) daily as needed.

## 2019-12-16 ENCOUNTER — Encounter: Payer: BC Managed Care – PPO | Admitting: Registered Nurse

## 2020-02-11 ENCOUNTER — Other Ambulatory Visit: Payer: Self-pay

## 2020-02-11 ENCOUNTER — Ambulatory Visit (INDEPENDENT_AMBULATORY_CARE_PROVIDER_SITE_OTHER): Payer: BC Managed Care – PPO | Admitting: Registered Nurse

## 2020-02-11 VITALS — BP 132/86 | HR 84 | Temp 98.6°F | Resp 18 | Ht 71.0 in | Wt 158.4 lb

## 2020-02-11 DIAGNOSIS — Z130 Encounter for screening for diseases of the blood and blood-forming organs and certain disorders involving the immune mechanism: Secondary | ICD-10-CM

## 2020-02-11 DIAGNOSIS — Z13220 Encounter for screening for lipoid disorders: Secondary | ICD-10-CM

## 2020-02-11 DIAGNOSIS — Z13228 Encounter for screening for other metabolic disorders: Secondary | ICD-10-CM

## 2020-02-11 DIAGNOSIS — Z7689 Persons encountering health services in other specified circumstances: Secondary | ICD-10-CM | POA: Diagnosis not present

## 2020-02-11 DIAGNOSIS — R040 Epistaxis: Secondary | ICD-10-CM

## 2020-02-11 DIAGNOSIS — Z1329 Encounter for screening for other suspected endocrine disorder: Secondary | ICD-10-CM

## 2020-02-11 NOTE — Progress Notes (Signed)
Established Patient Office Visit  Subjective:  Patient ID: Curtis Grimes, male    DOB: 1985/07/17  Age: 34 y.o. MRN: [id number]  CC:  Chief Complaint  Patient presents with  . Transitions Of Care    Patient states he is here for TOC. Per patient he would like to discuss some nose bleeds.    HPI Curtis Grimes presents for toc  Former pt of [place], [place] Histories reviewed, updated as warranted  Complains today of frequent nosebleeds: Ongoing since childhood. Always bad from sept-dec with change in weather No known seasonal allergies Has tried saline sinus rinses and humidifiers, no success Some last just 1-2 minutes, others can last 20-30 minutes No clear triggering factor Seem to be anterior - no pnd of blood or other respiratory implication Denies drug use and rhinotellexis   Otherwise he is feeling well.  Past Medical History:  Diagnosis Date  . Allergy    Phreesia 02/11/2020  . Depression   . Dysphagia   . Eosinophilic esophagitis     Past Surgical History:  Procedure Laterality Date  . VASECTOMY      Family History  Problem Relation Age of Onset  . Diabetes Mother   . Heart disease Mother   . Hypertension Mother   . Diabetes Maternal Grandmother   . Heart disease Maternal Grandmother   . Stroke Maternal Grandmother   . Hypertension Paternal Grandmother   . Colon cancer Neg Hx   . Esophageal cancer Neg Hx   . Rectal cancer Neg Hx   . Stomach cancer Neg Hx     Social History   Socioeconomic History  . Marital status: Divorced    Spouse name: Not on file  . Number of children: 2  . Years of education: Not on file  . Highest education level: Not on file  Occupational History  . Not on file  Tobacco Use  . Smoking status: Never Smoker  . Smokeless tobacco: Never Used  Vaping Use  . Vaping Use: Never used  Substance and Sexual Activity  . Alcohol use: Yes    Comment: occ  . Drug use: Never  . Sexual activity: Yes  Other Topics  Concern  . Not on file  Social History Narrative  . Not on file   Social Determinants of Health   Financial Resource Strain: Not on file  Food Insecurity: Not on file  Transportation Needs: Not on file  Physical Activity: Not on file  Stress: Not on file  Social Connections: Not on file  Intimate Partner Violence: Not on file    Outpatient Medications Prior to Visit  Medication Sig Dispense Refill  . ibuprofen (ADVIL,MOTRIN) 200 MG tablet Take 200 mg by mouth every 6 (six) hours as needed. (Patient not taking: No sig reported)    . Multiple Vitamin (MULTIVITAMIN) capsule Take 1 capsule by mouth daily. (Patient not taking: Reported on 02/11/2020)    . omeprazole (PRILOSEC) 40 MG capsule Take 1 capsule (40 mg total) by mouth daily. (Patient not taking: Reported on 02/11/2020) 90 capsule 3  . vitamin C (ASCORBIC ACID) 500 MG tablet Take 500 mg by mouth daily. (Patient not taking: Reported on 02/11/2020)     Facility-Administered Medications Prior to Visit  Medication Dose Route Frequency Provider Last Rate Last Admin  . 0.9 %  sodium chloride infusion  500 mL Intravenous Once Armbruster, Carlota Raspberry, MD        Allergies  Allergen Reactions  . Other Swelling  [place]   . Shellfish Allergy Swelling  . Beef-Derived Products   . Thick-It Chicken A La King [Nutritional Supplements]     Chicken     ROS Review of Systems  Constitutional: Negative.   HENT: Negative.   Eyes: Negative.   Respiratory: Negative.   Cardiovascular: Negative.   Gastrointestinal: Negative.   Genitourinary: Negative.   Musculoskeletal: Negative.   Skin: Negative.   Neurological: Negative.   Psychiatric/Behavioral: Negative.   All other systems reviewed and are negative.     Objective:    Physical Exam Vitals and nursing note reviewed.  Constitutional:      General: He is not in acute distress.    Appearance: Normal appearance. He is normal weight. He is not ill-appearing, toxic-appearing or  diaphoretic.  Cardiovascular:     Rate and Rhythm: Normal rate and regular rhythm.  Pulmonary:     Effort: Pulmonary effort is normal. No respiratory distress.  Skin:    General: Skin is warm and dry.  Neurological:     General: No focal deficit present.     Mental Status: He is alert and oriented to person, place, and time. Mental status is at baseline.  Psychiatric:        Mood and Affect: Mood normal.        Behavior: Behavior normal.        Thought Content: Thought content normal.        Judgment: Judgment normal.     BP 132/86   Pulse 84   Temp 98.6 F (37 C) (Temporal)   Resp 18   Ht '5\' 11"'  (1.803 m)   Wt 158 lb 6.4 oz (71.8 kg)   SpO2 99%   BMI 22.09 kg/m  Wt Readings from Last 3 Encounters:  02/11/20 158 lb 6.4 oz (71.8 kg)  11/05/19 151 lb (68.5 kg)  01/02/19 158 lb (71.7 kg)     There are no preventive care reminders to display for this patient.  There are no preventive care reminders to display for this patient.  No results found for: TSH Lab Results  Component Value Date   WBC 8.9 09/26/2017   HGB 15.4 09/26/2017   HCT 46.2 09/26/2017   MCV 88 09/26/2017   PLT 295 09/26/2017   No results found for: NA, K, CHLORIDE, CO2, GLUCOSE, BUN, CREATININE, BILITOT, ALKPHOS, AST, ALT, PROT, ALBUMIN, CALCIUM, ANIONGAP, EGFR, GFR No results found for: CHOL No results found for: HDL No results found for: LDLCALC No results found for: TRIG No results found for: CHOLHDL No results found for: HGBA1C    Assessment & Plan:   Problem List Items Addressed This Visit   None   Visit Diagnoses    Encounter to establish care    -  Primary   Frequent nosebleeds       Relevant Orders   Ambulatory referral to ENT   Screening for endocrine, metabolic and immunity disorder       Relevant Orders   CBC With Differential   Hemoglobin A1c   TSH   Comprehensive metabolic panel   Lipid screening       Relevant Orders   Lipid panel      No orders of the defined  types were placed in this encounter.   Follow-up: No follow-ups on file.   PLAN  Suggest application of thin layer of otc triple antibiotic ointment or petroleum jelly with cotton swab to nasal cavities once daily during effected times of year.  Will refer  to ENT for further assessment. Pt reports nasal cauterization as a child - interested in whether or not this is an option again at this time  Labs collected. Will follow up with the patient as warranted.  Patient encouraged to call clinic with any questions, comments, or concerns.  Maximiano Coss, NP

## 2020-02-11 NOTE — Patient Instructions (Signed)
° ° ° °  If you have lab work done today you will be contacted with your lab results within the next 2 weeks.  If you have not heard from us then please contact us. The fastest way to get your results is to register for My Chart. ° ° °IF you received an x-ray today, you will receive an invoice from [facility]. Please contact [facility] at [phone] with questions or concerns regarding your invoice.  ° °IF you received labwork today, you will receive an invoice from [facility]. Please contact [facility] at [phone] with questions or concerns regarding your invoice.  ° °Our billing staff will not be able to assist you with questions regarding bills from these companies. ° °You will be contacted with the lab results as soon as they are available. The fastest way to get your results is to activate your My Chart account. Instructions are located on the last page of this paperwork. If you have not heard from us regarding the results in 2 weeks, please contact this office. °  ° ° ° °

## 2020-02-12 LAB — LIPID PANEL
Chol/HDL Ratio: 4.3 ratio (ref 0.0–5.0)
Cholesterol, Total: 193 mg/dL (ref 100–199)
HDL: 45 mg/dL (ref >39)
LDL Chol Calc (NIH): 125 mg/dL — ABNORMAL HIGH (ref 0–99)
Triglycerides: 128 mg/dL (ref 0–149)
VLDL Cholesterol Cal: 23 mg/dL (ref 5–40)

## 2020-02-12 LAB — HEMOGLOBIN A1C
Est. average glucose Bld gHb Est-mCnc: 105 mg/dL
Hgb A1c MFr Bld: 5.3 % (ref 4.8–5.6)

## 2020-02-12 LAB — CBC WITH DIFFERENTIAL
Basophils Absolute: 0.1 10*3/uL (ref 0.0–0.2)
Basos: 1 %
EOS (ABSOLUTE): 0.6 10*3/uL — ABNORMAL HIGH (ref 0.0–0.4)
Eos: 7 %
Hematocrit: 45.9 % (ref 37.5–51.0)
Hemoglobin: 15.5 g/dL (ref 13.0–17.7)
Immature Grans (Abs): 0 10*3/uL (ref 0.0–0.1)
Immature Granulocytes: 0 %
Lymphocytes Absolute: 2.9 10*3/uL (ref 0.7–3.1)
Lymphs: 39 %
MCH: 29.5 pg (ref 26.6–33.0)
MCHC: 33.8 g/dL (ref 31.5–35.7)
MCV: 87 fL (ref 79–97)
Monocytes Absolute: 0.7 10*3/uL (ref 0.1–0.9)
Monocytes: 9 %
Neutrophils Absolute: 3.3 10*3/uL (ref 1.4–7.0)
Neutrophils: 44 %
RBC: 5.25 x10E6/uL (ref 4.14–5.80)
RDW: 12.4 % (ref 11.6–15.4)
WBC: 7.6 10*3/uL (ref 3.4–10.8)

## 2020-02-12 LAB — COMPREHENSIVE METABOLIC PANEL
ALT: 32 IU/L (ref 0–44)
AST: 31 IU/L (ref 0–40)
Albumin/Globulin Ratio: 2.1 (ref 1.2–2.2)
Albumin: 4.9 g/dL (ref 4.0–5.0)
Alkaline Phosphatase: 91 IU/L (ref 44–121)
BUN/Creatinine Ratio: 7 — ABNORMAL LOW (ref 9–20)
BUN: 6 mg/dL (ref 6–20)
Bilirubin Total: 0.6 mg/dL (ref 0.0–1.2)
CO2: 22 mmol/L (ref 20–29)
Calcium: 9.8 mg/dL (ref 8.7–10.2)
Chloride: 101 mmol/L (ref 96–106)
Creatinine, Ser: 0.83 mg/dL (ref 0.76–1.27)
GFR calc Af Amer: 133 mL/min/{1.73_m2} (ref >59)
GFR calc non Af Amer: 115 mL/min/{1.73_m2} (ref >59)
Globulin, Total: 2.3 g/dL (ref 1.5–4.5)
Glucose: 91 mg/dL (ref 65–99)
Potassium: 4.2 mmol/L (ref 3.5–5.2)
Sodium: 141 mmol/L (ref 134–144)
Total Protein: 7.2 g/dL (ref 6.0–8.5)

## 2020-02-12 LAB — TSH: TSH: 1.2 u[IU]/mL (ref 0.450–4.500)

## 2020-04-23 ENCOUNTER — Ambulatory Visit (INDEPENDENT_AMBULATORY_CARE_PROVIDER_SITE_OTHER): Payer: BC Managed Care – PPO | Admitting: Otolaryngology

## 2020-11-02 NOTE — Progress Notes (Deleted)
Follow Up Note  RE: Curtis Grimes MRN: [id number] DOB: 07/28/1985 Date of Office Visit: 11/03/2020  Referring provider: Magdalene River, PA* Primary care provider: Janeece Agee, NP  Chief Complaint: No chief complaint on file.  History of Present Illness: I had the pleasure of seeing Curtis Grimes for a follow up visit at the [facility] on 11/02/2020. He is a 35 y.o. male, who is being followed for eosinophilic esophagitis, adverse food reaction and allergic rhinitis. His previous allergy office visit was on 11/05/2019 with Dr. Selena Batten. Today is a regular follow up visit.  Eosinophilic esophagitis Past history - Issues with swallowing food for the past few years and worse in the last 6 months.  No specific food triggers noted. EGD in October 2019 showed elevated eosinophils and required dilatation - 27 eos/hpf.  Did not start Flovent or budesonide due to cost. 2019 skin testing was positive to shellfish, pork, [place], chicken, beef and lamb. 2020 skin testing was positive to shellfish, [place] and chicken. Borderline positive to pork. Negative to beef and lamb. Interim history - 1 EGD with dilatations in November 2020. Doing well with no symptoms and taking omeprazole 40mg  daily. Today's skin testing was positive to shellfish, shrimp and lobster. Negative to crab, pork, , chicken, beef, lamb.  Continue to avoid shellfish. Okay to reintroduce these foods back into the diet. Start with beef, pork and lamb. Then reintroduce chicken and [place].  Recommend doing one food at a time and consume a few times per week. Monitor for symptoms If you notice any symptoms then stop and eliminate from your diet. Continue omeprazole 40mg  daily.    Other allergic rhinitis Past history - Rhinitis symptoms for the past 2 years mainly during the fall.  Zyrtec with good benefit. Interim history - Asymptomatic with no medications.  May use over the counter antihistamines such as Zyrtec  (cetirizine), Claritin (loratadine), Allegra (fexofenadine), or Xyzal (levocetirizine) daily as needed.   Return in about 1 year (around 11/04/2020).  Assessment and Plan: Curtis Grimes is a 35 y.o. male with: No problem-specific Assessment & Plan notes found for this encounter.  No follow-ups on file.  No orders of the defined types were placed in this encounter.  Lab Orders  No laboratory test(s) ordered today    Diagnostics: Spirometry:  Tracings reviewed. His effort: {Blank single:19197::"Good reproducible efforts.","It was hard to get consistent efforts and there is a question as to whether this reflects a maximal maneuver.","Poor effort, data can not be interpreted."} FVC: ***L FEV1: ***L, ***% predicted FEV1/FVC ratio: ***% Interpretation: {Blank single:19197::"Spirometry consistent with mild obstructive disease","Spirometry consistent with moderate obstructive disease","Spirometry consistent with severe obstructive disease","Spirometry consistent with possible restrictive disease","Spirometry consistent with mixed obstructive and restrictive disease","Spirometry uninterpretable due to technique","Spirometry consistent with normal pattern","No overt abnormalities noted given today's efforts"}.  Please see scanned spirometry results for details.  Skin Testing: {Blank single:19197::"Select foods","Environmental allergy panel","Environmental allergy panel and select foods","Food allergy panel","None","Deferred due to recent antihistamines use"}. *** Results discussed with patient/family.   Medication List:  No current outpatient medications on file.   Current Facility-Administered Medications  Medication Dose Route Frequency Provider Last Rate Last Admin   0.9 %  sodium chloride infusion  500 mL Intravenous Once Armbruster, Alycia Rossetti, MD       Allergies: Allergies  Allergen Reactions   Other Swelling    31    Shellfish Allergy Swelling   Beef-Derived Products    Thick-It  Chicken A Willaim Rayas [  Nutritional Supplements]     Chicken    I reviewed his past medical history, social history, family history, and environmental history and no significant changes have been reported from his previous visit.  Review of Systems  Constitutional:  Negative for appetite change, chills, fever and unexpected weight change.  HENT:  Negative for congestion and rhinorrhea.   Eyes:  Negative for itching.  Respiratory:  Negative for cough, chest tightness, shortness of breath and wheezing.   Cardiovascular:  Negative for chest pain.  Gastrointestinal:  Negative for abdominal pain.  Genitourinary:  Negative for difficulty urinating.  Skin:  Negative for rash.  Allergic/Immunologic: Positive for food allergies.  Neurological:  Negative for headaches.   Objective: There were no vitals taken for this visit. There is no height or weight on file to calculate BMI. Physical Exam Vitals and nursing note reviewed.  Constitutional:      Appearance: He is well-developed.  HENT:     Head: Normocephalic and atraumatic.     Right Ear: External ear normal.     Left Ear: External ear normal.     Nose: Nose normal.     Mouth/Throat:     Mouth: Mucous membranes are moist.     Pharynx: Oropharynx is clear.  Eyes:     Conjunctiva/sclera: Conjunctivae normal.  Cardiovascular:     Rate and Rhythm: Normal rate and regular rhythm.     Heart sounds: Normal heart sounds. No murmur heard.   No friction rub. No gallop.  Pulmonary:     Effort: Pulmonary effort is normal.     Breath sounds: Normal breath sounds. No wheezing or rales.  Musculoskeletal:     Cervical back: Neck supple.  Skin:    General: Skin is warm.     Findings: No rash.  Neurological:     Mental Status: He is alert and oriented to person, place, and time.  Psychiatric:        Behavior: Behavior normal.   Previous notes and tests were reviewed. The plan was reviewed with the patient/family, and all questions/concerned were  addressed.  It was my pleasure to see Curtis Grimes today and participate in his care. Please feel free to contact me with any questions or concerns.  Sincerely,  Wyline Mood, DO Allergy & Immunology  [facility] office: [phone] [facility] office: [phone]

## 2020-11-03 ENCOUNTER — Ambulatory Visit: Payer: BC Managed Care – PPO | Admitting: Allergy

## 2020-11-03 DIAGNOSIS — T781XXD Other adverse food reactions, not elsewhere classified, subsequent encounter: Secondary | ICD-10-CM

## 2020-11-03 DIAGNOSIS — K200 Eosinophilic esophagitis: Secondary | ICD-10-CM

## 2020-11-03 DIAGNOSIS — J309 Allergic rhinitis, unspecified: Secondary | ICD-10-CM

## 2020-11-03 DIAGNOSIS — J3089 Other allergic rhinitis: Secondary | ICD-10-CM

## 2022-01-23 DIAGNOSIS — R040 Epistaxis: Secondary | ICD-10-CM | POA: Diagnosis not present

## 2022-02-22 DIAGNOSIS — J342 Deviated nasal septum: Secondary | ICD-10-CM | POA: Diagnosis not present

## 2022-02-22 DIAGNOSIS — R040 Epistaxis: Secondary | ICD-10-CM | POA: Diagnosis not present

## 2022-02-22 DIAGNOSIS — J3489 Other specified disorders of nose and nasal sinuses: Secondary | ICD-10-CM | POA: Diagnosis not present

## 2023-02-28 DIAGNOSIS — I639 Cerebral infarction, unspecified: Secondary | ICD-10-CM

## 2023-02-28 HISTORY — DX: Cerebral infarction, unspecified: I63.9

## 2023-03-18 ENCOUNTER — Emergency Department (HOSPITAL_COMMUNITY): Payer: BC Managed Care – PPO

## 2023-03-18 ENCOUNTER — Encounter (HOSPITAL_COMMUNITY): Payer: Self-pay

## 2023-03-18 ENCOUNTER — Other Ambulatory Visit: Payer: Self-pay

## 2023-03-18 ENCOUNTER — Observation Stay (HOSPITAL_COMMUNITY)
Admission: EM | Admit: 2023-03-18 | Discharge: 2023-03-20 | Disposition: A | Discharge status: Home / Self Care | Payer: BC Managed Care – PPO | Attending: Family Medicine | Admitting: Family Medicine

## 2023-03-18 DIAGNOSIS — J3089 Other allergic rhinitis: Secondary | ICD-10-CM | POA: Insufficient documentation

## 2023-03-18 DIAGNOSIS — I639 Cerebral infarction, unspecified: Secondary | ICD-10-CM | POA: Insufficient documentation

## 2023-03-18 DIAGNOSIS — E041 Nontoxic single thyroid nodule: Secondary | ICD-10-CM | POA: Diagnosis not present

## 2023-03-18 DIAGNOSIS — I7774 Dissection of vertebral artery: Principal | ICD-10-CM

## 2023-03-18 DIAGNOSIS — I6322 Cerebral infarction due to unspecified occlusion or stenosis of basilar arteries: Secondary | ICD-10-CM | POA: Diagnosis not present

## 2023-03-18 DIAGNOSIS — R42 Dizziness and giddiness: Principal | ICD-10-CM | POA: Insufficient documentation

## 2023-03-18 DIAGNOSIS — M47812 Spondylosis without myelopathy or radiculopathy, cervical region: Secondary | ICD-10-CM | POA: Diagnosis not present

## 2023-03-18 DIAGNOSIS — Z79899 Other long term (current) drug therapy: Secondary | ICD-10-CM | POA: Insufficient documentation

## 2023-03-18 DIAGNOSIS — R200 Anesthesia of skin: Secondary | ICD-10-CM | POA: Diagnosis not present

## 2023-03-18 DIAGNOSIS — E876 Hypokalemia: Secondary | ICD-10-CM | POA: Diagnosis not present

## 2023-03-18 DIAGNOSIS — M5001 Cervical disc disorder with myelopathy,  high cervical region: Secondary | ICD-10-CM | POA: Diagnosis not present

## 2023-03-18 DIAGNOSIS — R29818 Other symptoms and signs involving the nervous system: Secondary | ICD-10-CM | POA: Diagnosis not present

## 2023-03-18 DIAGNOSIS — I63219 Cerebral infarction due to unspecified occlusion or stenosis of unspecified vertebral arteries: Secondary | ICD-10-CM

## 2023-03-18 DIAGNOSIS — R519 Headache, unspecified: Secondary | ICD-10-CM | POA: Diagnosis not present

## 2023-03-18 DIAGNOSIS — M4802 Spinal stenosis, cervical region: Secondary | ICD-10-CM | POA: Diagnosis not present

## 2023-03-18 DIAGNOSIS — R9431 Abnormal electrocardiogram [ECG] [EKG]: Secondary | ICD-10-CM | POA: Diagnosis not present

## 2023-03-18 HISTORY — DX: Meningitis, unspecified: G03.9

## 2023-03-18 LAB — CBC
HCT: 43.1 % (ref 39.0–52.0)
Hemoglobin: 14.8 g/dL (ref 13.0–17.0)
MCH: 29.8 pg (ref 26.0–34.0)
MCHC: 34.3 g/dL (ref 30.0–36.0)
MCV: 86.9 fL (ref 80.0–100.0)
Platelets: 287 10*3/uL (ref 150–400)
RBC: 4.96 MIL/uL (ref 4.22–5.81)
RDW: 12.5 % (ref 11.5–15.5)
WBC: 8.6 10*3/uL (ref 4.0–10.5)
nRBC: 0 % (ref 0.0–0.2)

## 2023-03-18 LAB — DIFFERENTIAL
Abs Immature Granulocytes: 0.01 10*3/uL (ref 0.00–0.07)
Basophils Absolute: 0.1 10*3/uL (ref 0.0–0.1)
Basophils Relative: 1 %
Eosinophils Absolute: 0.6 10*3/uL — ABNORMAL HIGH (ref 0.0–0.5)
Eosinophils Relative: 7 %
Immature Granulocytes: 0 %
Lymphocytes Relative: 39 %
Lymphs Abs: 3.3 10*3/uL (ref 0.7–4.0)
Monocytes Absolute: 0.8 10*3/uL (ref 0.1–1.0)
Monocytes Relative: 9 %
Neutro Abs: 3.8 10*3/uL (ref 1.7–7.7)
Neutrophils Relative %: 44 %

## 2023-03-18 LAB — APTT: aPTT: 27 s (ref 24–36)

## 2023-03-18 LAB — I-STAT CHEM 8, ED
BUN: 6 mg/dL (ref 6–20)
Calcium, Ion: 1.09 mmol/L — ABNORMAL LOW (ref 1.15–1.40)
Chloride: 104 mmol/L (ref 98–111)
Creatinine, Ser: 0.8 mg/dL (ref 0.61–1.24)
Glucose, Bld: 124 mg/dL — ABNORMAL HIGH (ref 70–99)
HCT: 48 % (ref 39.0–52.0)
Hemoglobin: 16.3 g/dL (ref 13.0–17.0)
Potassium: 3.4 mmol/L — ABNORMAL LOW (ref 3.5–5.1)
Sodium: 139 mmol/L (ref 135–145)
TCO2: 23 mmol/L (ref 22–32)

## 2023-03-18 LAB — CBG MONITORING, ED: Glucose-Capillary: 121 mg/dL — ABNORMAL HIGH (ref 70–99)

## 2023-03-18 LAB — COMPREHENSIVE METABOLIC PANEL
ALT: 21 U/L (ref 0–44)
AST: 22 U/L (ref 15–41)
Albumin: 3.9 g/dL (ref 3.5–5.0)
Alkaline Phosphatase: 71 U/L (ref 38–126)
Anion gap: 10 (ref 5–15)
BUN: 5 mg/dL — ABNORMAL LOW (ref 6–20)
CO2: 22 mmol/L (ref 22–32)
Calcium: 9.1 mg/dL (ref 8.9–10.3)
Chloride: 105 mmol/L (ref 98–111)
Creatinine, Ser: 0.95 mg/dL (ref 0.61–1.24)
GFR, Estimated: >60 mL/min (ref >60)
Glucose, Bld: 138 mg/dL — ABNORMAL HIGH (ref 70–99)
Potassium: 3.5 mmol/L (ref 3.5–5.1)
Sodium: 137 mmol/L (ref 135–145)
Total Bilirubin: 0.7 mg/dL (ref 0.0–1.2)
Total Protein: 7.1 g/dL (ref 6.5–8.1)

## 2023-03-18 LAB — ETHANOL: Alcohol, Ethyl (B): <10 mg/dL (ref <10)

## 2023-03-18 LAB — ANTITHROMBIN III: AntiThromb III Func: 110 % (ref 75–120)

## 2023-03-18 LAB — PROTIME-INR
INR: 1 (ref 0.8–1.2)
Prothrombin Time: 13.5 s (ref 11.4–15.2)

## 2023-03-18 LAB — HEMOGLOBIN A1C
Hgb A1c MFr Bld: 5.4 % (ref 4.8–5.6)
Mean Plasma Glucose: 108.28 mg/dL

## 2023-03-18 MED ORDER — ACETAMINOPHEN 160 MG/5ML PO SOLN
650.0000 mg | Freq: Once | ORAL | Status: AC
  Administered 2023-03-18: 650 mg via ORAL
  Filled 2023-03-18: qty 20.3

## 2023-03-18 MED ORDER — ASPIRIN 325 MG PO TABS
325.0000 mg | ORAL_TABLET | Freq: Once | ORAL | Status: AC
  Administered 2023-03-18: 325 mg via ORAL
  Filled 2023-03-18: qty 1

## 2023-03-18 MED ORDER — LORAZEPAM 2 MG/ML IJ SOLN
1.0000 mg | Freq: Once | INTRAMUSCULAR | Status: AC
  Administered 2023-03-18: 1 mg via INTRAVENOUS
  Filled 2023-03-18: qty 1

## 2023-03-18 MED ORDER — STROKE: EARLY STAGES OF RECOVERY BOOK
Freq: Once | Status: AC
Start: 2023-03-19 — End: 2023-03-19
  Administered 2023-03-19: 1
  Filled 2023-03-18: qty 1

## 2023-03-18 MED ORDER — DIPHENHYDRAMINE HCL 50 MG/ML IJ SOLN
25.0000 mg | Freq: Once | INTRAMUSCULAR | Status: AC
  Administered 2023-03-18: 25 mg via INTRAVENOUS
  Filled 2023-03-18: qty 1

## 2023-03-18 MED ORDER — CLOPIDOGREL BISULFATE 300 MG PO TABS
300.0000 mg | ORAL_TABLET | Freq: Once | ORAL | Status: AC
  Administered 2023-03-18: 300 mg via ORAL
  Filled 2023-03-18: qty 1

## 2023-03-18 MED ORDER — ACETAMINOPHEN 500 MG PO TABS
1000.0000 mg | ORAL_TABLET | Freq: Once | ORAL | Status: DC
  Filled 2023-03-18: qty 2

## 2023-03-18 MED ORDER — IOHEXOL 350 MG/ML SOLN
75.0000 mL | Freq: Once | INTRAVENOUS | Status: AC | PRN
  Administered 2023-03-18: 75 mL via INTRAVENOUS

## 2023-03-18 MED ORDER — KETOROLAC TROMETHAMINE 15 MG/ML IJ SOLN
15.0000 mg | Freq: Once | INTRAMUSCULAR | Status: AC
  Administered 2023-03-18: 15 mg via INTRAVENOUS
  Filled 2023-03-18: qty 1

## 2023-03-18 MED ORDER — CLOPIDOGREL BISULFATE 75 MG PO TABS
75.0000 mg | ORAL_TABLET | Freq: Every day | ORAL | Status: DC
  Administered 2023-03-19 – 2023-03-20 (×2): 75 mg via ORAL
  Filled 2023-03-18 (×2): qty 1

## 2023-03-18 MED ORDER — MECLIZINE HCL 25 MG PO TABS
25.0000 mg | ORAL_TABLET | Freq: Three times a day (TID) | ORAL | Status: DC | PRN
  Administered 2023-03-18: 25 mg via ORAL
  Filled 2023-03-18: qty 1

## 2023-03-18 MED ORDER — MECLIZINE HCL 25 MG PO TABS
25.0000 mg | ORAL_TABLET | Freq: Once | ORAL | Status: AC
  Administered 2023-03-18: 25 mg via ORAL
  Filled 2023-03-18: qty 1

## 2023-03-18 MED ORDER — ASPIRIN 81 MG PO TBEC
81.0000 mg | DELAYED_RELEASE_TABLET | Freq: Every day | ORAL | Status: DC
  Administered 2023-03-19 – 2023-03-20 (×2): 81 mg via ORAL
  Filled 2023-03-18 (×2): qty 1

## 2023-03-18 MED ORDER — SODIUM CHLORIDE 0.9% FLUSH
3.0000 mL | Freq: Once | INTRAVENOUS | Status: AC
  Administered 2023-03-18: 3 mL via INTRAVENOUS

## 2023-03-18 MED ORDER — ONDANSETRON HCL 4 MG/2ML IJ SOLN
4.0000 mg | Freq: Once | INTRAMUSCULAR | Status: AC
  Administered 2023-03-18: 4 mg via INTRAVENOUS
  Filled 2023-03-18: qty 2

## 2023-03-18 MED ORDER — PROCHLORPERAZINE EDISYLATE 10 MG/2ML IJ SOLN
10.0000 mg | Freq: Once | INTRAMUSCULAR | Status: AC
  Administered 2023-03-18: 10 mg via INTRAVENOUS
  Filled 2023-03-18: qty 2

## 2023-03-18 MED ORDER — LORAZEPAM 2 MG/ML IJ SOLN: 1.0000 mg | INTRAMUSCULAR | Status: DC | PRN

## 2023-03-18 NOTE — ED Triage Notes (Signed)
Pt states he woke up around 5am this morning with a headache, left arm and left leg numbness. Pt also c.o feeling off balance. No weakness noted on exam, sensory is equal.

## 2023-03-18 NOTE — ED Provider Notes (Signed)
Patient being followed by neurology.  Suspected on want a medical admission for the questionable left vertebral artery dissection.  There was some question of whether he needs an lumbar puncture.  Would they do not need that anymore.  As stated neurology will continue to follow.   Vanetta Mulders, MD 03/18/23 Rickey Primus

## 2023-03-18 NOTE — H&P (Signed)
History and Physical    Patient: Curtis Grimes:[id number] DOB: [birth year] DOA: 03/18/2023 DOS: the patient was seen and examined on 03/18/2023 PCP: Janeece Agee, NP  Patient coming from: Home  Chief Complaint:  Chief Complaint  Patient presents with   Headache   Numbness   HPI: Curtis Grimes is a 39 y.o. male with medical history significant of depression, anxiety, allergic rhinitis, eosinophilic esophagitis and meningitis in the past who has had also vertigo about 11 years ago apparently lasting up to a week and also some in the ER infections in the past presented to the ER today with left-sided headache that he woke up with this morning.  He was pounding apparently.  Also associated with the left lower extremity and left fingertip numbness and tingling as well as incoordinated gait early in the morning.  Symptoms have improved.  In the ER however he started having vertigo.  Also having left sensory complaints.  He has been extensively worked up by neurology including CT head, MRI of the brain and cervical spine all within normal.  CT angiogram of the brain and neck showed possible vertebral artery dissection.  Neurology has recommended medical admission and they will follow for any further recommendations.  Patient is otherwise stable with no new complaints.  Review of Systems: As mentioned in the history of present illness. All other systems reviewed and are negative. Past Medical History:  Diagnosis Date   Allergy    Phreesia 02/11/2020   Depression    Dysphagia    Eosinophilic esophagitis    Meningitis    Past Surgical History:  Procedure Laterality Date   CAUTERIZE INNER NOSE     childhood   VASECTOMY     Social History:  reports that he has never smoked. He has never used smokeless tobacco. He reports current alcohol use. He reports that he does not use drugs.  Allergies  Allergen Reactions   Other Swelling    [place]    Shellfish Allergy Swelling   Beef-Derived  Drug Products    Thick-It Chicken A La King [Nutritional Supplements]     Chicken     Family History  Problem Relation Age of Onset   Diabetes Mother    Heart disease Mother    Hypertension Mother    Diabetes Maternal Grandmother    Heart disease Maternal Grandmother    Stroke Maternal Grandmother    Hypertension Paternal Grandmother    Colon cancer Neg Hx    Esophageal cancer Neg Hx    Rectal cancer Neg Hx    Stomach cancer Neg Hx     Prior to Admission medications   Medication Sig Start Date End Date Taking? Authorizing Provider  ibuprofen (ADVIL) 200 MG tablet Take 400 mg by mouth every 6 (six) hours as needed for headache.   Yes [provider]    Physical Exam: Vitals:   03/18/23 1130 03/18/23 1615 03/18/23 1619 03/18/23 1800  BP: (!) 131/99 112/73  122/88  Pulse: 71 70  78  Resp: 16 12  16   Temp:   98 F (36.7 C)   SpO2: 96% 94%  96%  Weight:      Height:       Constitutional: NAD, calm, comfortable Eyes: PERRL, lids and conjunctivae normal ENMT: Mucous membranes are moist. Posterior pharynx clear of any exudate or lesions.Normal dentition.  Neck: normal, supple, no masses, no thyromegaly Respiratory: clear to auscultation bilaterally, no wheezing, no crackles. Normal respiratory effort. No accessory muscle  use.  Cardiovascular: Regular rate and rhythm, no murmurs / rubs / gallops. No extremity edema. 2+ pedal pulses. No carotid bruits.  Abdomen: no tenderness, no masses palpated. No hepatosplenomegaly. Bowel sounds positive.  Musculoskeletal: Good range of motion, no joint swelling or tenderness, Skin: no rashes, lesions, ulcers. No induration Neurologic: CN 2-12 grossly intact. Sensation intact, DTR normal. Strength 5/5 in all 4.  Psychiatric: Normal judgment and insight. Alert and oriented x 3. Normal mood  Data Reviewed:  CBC within normal.  Potassium 3.4, glucose 124.  Hemoglobin A1c is 5.4.  Head CT without contrast is negative CT angiogram of  the head and neck showed multiple findings but normal carotid bifurcations.  There is a dominant right vertebral artery which is widely patent through the foreman magnum to the basilar artery.  Left vertebral artery is a tiny vessel there is a question if the lumen becomes even more narrow around the C3 C5 level indicating a dissection.  It could also be left vertebral artery abnormality at the foreman magnum and V4 segment.  Also left thyroid nodule about 18 mm.  Assessment and Plan:  #1 vertigo and headache: Cause is unclear.  Suspected vertebral artery dissection.  Patient already seen and reviewed by neurology.  They will follow-up.  Will admit to the medical service.  Supportive care only.  Follow recommendations of the neurologist.  #2 hypokalemia: Replete potassium  #3 history of allergies: No ongoing symptoms.  Continue to monitor    Advance Care Planning:   Code Status: Not on file full code  Consults: Dr. Iver Nestle, neurologist  Family Communication: No family at bedside  Severity of Illness: The appropriate patient status for this patient is OBSERVATION. Observation status is judged to be reasonable and necessary in order to provide the required intensity of service to ensure the patient's safety. The patient's presenting symptoms, physical exam findings, and initial radiographic and laboratory data in the context of their medical condition is felt to place them at decreased risk for further clinical deterioration. Furthermore, it is anticipated that the patient will be medically stable for discharge from the hospital within 2 midnights of admission.   AuthorLonia Blood, MD 03/18/2023 6:48 PM  For on call review [email or web address].

## 2023-03-18 NOTE — ED Notes (Signed)
Admitting Provider at bedside. 

## 2023-03-18 NOTE — ED Provider Notes (Signed)
Curtis Grimes Provider Note   CSN: [id number] Arrival date & time: 03/18/23  0707     History {Add pertinent medical, surgical, social history, OB history to HPI:1} Chief Complaint  Patient presents with  . Headache  . Numbness    Curtis Grimes is a 38 y.o. male with PMH as listed below who presents stating he woke up around 5am this morning with a headache, left arm and left leg numbness.  Last known normal was 11 PM when he went to sleep last night.  The headache started yesterday, located on the left side and is throbbing in nature.  Patient does have a history of viral meningitis several years ago but states that the symptoms feel completely different.  The viral meningitis symptoms had an allover headache that was extremely severe with severe photophobia.  Patient's headache now is left-sided only with no photophobia and not as severe.  He also did not have any other neurologic symptoms with the viral meningitis.  Patient's left-sided hand and left leg numbness have not gone away, also feels tingling.  Also complains of feeling off balance, lightheaded, and like he might pass out.  Has not fallen or had any head trauma.  Denies any alcohol or drugs.  No history of stroke.  Does not take a blood thinner.  Denies fever/chills, flulike symptoms, chest pain, shortness of breath, cough, nausea vomiting diarrhea constipation, urinary symptoms, leg swelling.  Past Medical History:  Diagnosis Date  . Allergy    Phreesia 02/11/2020  . Depression   . Dysphagia   . Eosinophilic esophagitis        Home Medications Prior to Admission medications   Not on File      Allergies    Other, Shellfish allergy, Beef-derived drug products, and Thick-it chicken a la king [nutritional supplements]    Review of Systems   Review of Systems A 10 point review of systems was performed and is negative unless otherwise reported in HPI.  Physical Exam Updated  Vital Signs BP 125/83 (BP Location: Right Arm)   Pulse (!) 55   Temp (!) 97.5 F (36.4 C)   Resp 16   Ht 5\' 11"  (1.803 m)   Wt 79.4 kg   SpO2 100%   BMI 24.41 kg/m  Physical Exam General: Normal appearing {Desc; male/male:11659}, lying in bed.  HEENT: PERRLA, Sclera anicteric, MMM, trachea midline.  Cardiology: RRR, no murmurs/rubs/gallops. BL radial and DP pulses equal bilaterally.  Resp: Normal respiratory rate and effort. CTAB, no wheezes, rhonchi, crackles.  Abd: Soft, non-tender, non-distended. No rebound tenderness or guarding.  GU: Deferred. MSK: No peripheral edema or signs of trauma. Extremities without deformity or TTP. No cyanosis or clubbing. Skin: warm, dry. No rashes or lesions. Back: No CVA tenderness Neuro: A&Ox4, CNs II-XII grossly intact. MAEs. Sensation grossly intact.  Psych: Normal mood and affect.   1a  Level of consciousness: 0=alert; keenly responsive  1b. LOC questions:  0=Performs both tasks correctly  1c. LOC commands: 0=Performs both tasks correctly  2.  Best Gaze: 0=normal  3.  Visual: 0=No visual loss  4. Facial Palsy: 0=Normal symmetric movement  5a.  Motor left arm: 0=No drift, limb holds 90 (or 45) degrees for full 10 seconds  5b.  Motor right arm: 0=No drift, limb holds 90 (or 45) degrees for full 10 seconds  6a. motor left leg: 0=No drift, limb holds 90 (or 45) degrees for full 10 seconds  6b  Motor right leg:  0=No drift, limb holds 90 (or 45) degrees for full 10 seconds  7. Limb Ataxia: 0=Absent  8.  Sensory: 1=Mild to moderate sensory loss; patient feels pinprick is less sharp or is dull on the affected side; there is a loss of superficial pain with pinprick but patient is aware He is being touched  9. Best Language:  0=No aphasia, normal  10. Dysarthria: 0=Normal  11. Extinction and Inattention: 0=No abnormality   Total:   1        ED Results / Procedures / Treatments   Labs (all labs ordered are listed, but only abnormal results  are displayed) Labs Reviewed  CBG MONITORING, ED - Abnormal; Notable for the following components:      Result Value   Glucose-Capillary 121 (*)    All other components within normal limits  PROTIME-INR  APTT  CBC  DIFFERENTIAL  COMPREHENSIVE METABOLIC PANEL  ETHANOL  I-STAT CHEM 8, ED  CBG MONITORING, ED    EKG None  Radiology No results found.  Procedures Procedures  {Document cardiac monitor, telemetry assessment procedure when appropriate:1}  Medications Ordered in ED Medications  sodium chloride flush (NS) 0.9 % injection 3 mL (has no administration in time range)    ED Course/ Medical Decision Making/ A&P                          Medical Decision Making Amount and/or Complexity of Data Reviewed Labs: ordered. Decision-making details documented in ED Course. Radiology: ordered.    This patient presents to the ED for concern of HA/left-sided numbness/tingling, this involves an extensive number of treatment options, and is a complaint that carries with it a high risk of complications and morbidity.  I considered the following differential and admission for this acute, potentially life threatening condition.   MDM:    Left-sided headache and left-sided numbness tingling.  Consider CVA as most concerning possible etiology.  Patient woke up with symptoms and last known well was 11 PM, not within the window for any thrombolytics.  Will obtain CT head to urgently rule out intracranial hemorrhage.  Also consider hydrocephalus or mass-occupying lesion.  Also consider complex migraine, electrolyte derangement, hypo-/hyperglycemia.  Will treat with headache cocktail and reexamined.  Obviously must consider viral meningitis or bacterial meningitis in a patient with a history of the same.  He has not had any fever and states that his symptoms now are very different than the last time.  He has no fever or leukocytosis on exam, no nuchal rigidity noted on exam with negative  Brudzinski, which is reassuring against meningitis.    Clinical Course as of 03/18/23 1610  Wynelle Link Mar 18, 2023  0726 Glucose-Capillary(!): 121 [HN]  0804 Alcohol, Ethyl (B): <10 neg [HN]  0804 Comprehensive metabolic panel(!) Unremarkable in the context of this patient's presentation  [HN]  0804 Coags wnl, no leukocytosis or anemia [HN]    Clinical Course User Index [HN] Loetta Rough, MD    Labs: I Ordered, and personally interpreted labs.  The pertinent results include: Those listed above  Imaging Studies ordered: CTH ordered from triage I independently visualized and interpreted imaging. I agree with the radiologist interpretation  Additional history obtained from chart review.   Cardiac Monitoring: .The patient was maintained on a cardiac monitor.  I personally viewed and interpreted the cardiac monitored which showed an underlying rhythm of: Normal sinus rhythm  Reevaluation: After the interventions noted above,  I reevaluated the patient and found that they have :{resolved/improved/worsened:23923::"improved"}  Social Determinants of Health: . lives independently  Disposition:  ***  Co morbidities that complicate the patient evaluation . Past Medical History:  Diagnosis Date  . Allergy    Phreesia 02/11/2020  . Depression   . Dysphagia   . Eosinophilic esophagitis      Medicines Meds ordered this encounter  Medications  . sodium chloride flush (NS) 0.9 % injection 3 mL    I have reviewed the patients home medicines and have made adjustments as needed  Problem List / ED Course: Problem List Items Addressed This Visit   None        {Document critical care time when appropriate:1} {Document review of labs and clinical decision tools ie heart score, Chads2Vasc2 etc:1}  {Document your independent review of radiology images, and any outside records:1} {Document your discussion with family members, caretakers, and with consultants:1} {Document social  determinants of health affecting pt's care:1} {Document your decision making why or why not admission, treatments were needed:1}  This note was created using dictation software, which may contain spelling or grammatical errors.

## 2023-03-18 NOTE — Consult Note (Signed)
NEUROLOGY CONSULT NOTE   Date of service: March 18, 2023 Patient Name: Curtis Grimes MRN:  [id number] DOB:  1985/08/31 Chief Complaint: "Vertigo with transient left lower extremity numbness and tingling with associated headache" Requesting Provider: Loetta Rough, MD  History of Present Illness  Curtis Grimes is a 38 y.o. male  has a past medical history of Allergy, Depression, Dysphagia, Eosinophilic esophagitis, and Meningitis.  as well as vertigo about 11 years ago for one week associated with an inner ear infection who presents to the ED 03/18/23 for evaluation of waking up with a pounding left-sided headache, left lower extremity and left fingertip numbness and tingling, with associated incoordinated gait this morning at 05:00.  Once in the ED, the patient began to experience a room spinning sensation.  Due to his dizziness and left sensory complaints, an MRI brain and cervical spine imaging were obtained without acute findings. Patient continues to endorse a room spinning sensation in addition to nausea with improved sensory disturbances and resolution of his previous headache -- found to have new nystagmus for which neurology was formally consulted  ROS  Comprehensive ROS performed and pertinent positives documented in HPI   Past History   Past Medical History:  Diagnosis Date   Allergy    Phreesia 02/11/2020   Depression    Dysphagia    Eosinophilic esophagitis    Meningitis    Past Surgical History:  Procedure Laterality Date   CAUTERIZE INNER NOSE     childhood   VASECTOMY     Family History: Family History  Problem Relation Age of Onset   Diabetes Mother    Heart disease Mother    Hypertension Mother    Diabetes Maternal Grandmother    Heart disease Maternal Grandmother    Stroke Maternal Grandmother    Hypertension Paternal Grandmother    Colon cancer Neg Hx    Esophageal cancer Neg Hx    Rectal cancer Neg Hx    Stomach cancer Neg Hx    Social History   reports that he has never smoked. He has never used smokeless tobacco. He reports current alcohol use. He reports that he does not use drugs.  Allergies  Allergen Reactions   Other Swelling    [place]    Shellfish Allergy Swelling   Beef-Derived Drug Products    Thick-It Chicken [employer] [Nutritional Supplements]     Chicken    Medications   Current Facility-Administered Medications:    0.9 %  sodium chloride infusion, 500 mL, Intravenous, Once, Armbruster, Willaim Rayas, MD  Current Outpatient Medications:    ibuprofen (ADVIL) 200 MG tablet, Take 400 mg by mouth every 6 (six) hours as needed for headache., Disp: , Rfl:   Vitals   Vitals:   03/18/23 0900 03/18/23 0930 03/18/23 1100 03/18/23 1130  BP: 124/88 119/86 107/75 (!) 131/99  Pulse: 82 62 (!) 59 71  Resp: 18 14 15 16   Temp:      SpO2: 98% 98% 97% 96%  Weight:      Height:        Body mass index is 24.41 kg/m.  Physical Exam   Constitutional: Appears well-developed and well-nourished Caucasian male laying in ER stretcher Psych: Affect appropriate to situation. Patient is calm and cooperative with exam.  Eyes: No scleral injection. Wears eyeglasses.  HENT: No OP obstruction.  Head: Normocephalic and atraumatic  Cardiovascular: Normal rate and regular rhythm Respiratory: Effort normal, non-labored breathing on room air  GI: Soft.  No distension. There is no tenderness.  Skin: WDI.   Neurologic Examination   Mental Status: Patient is awake, alert, oriented to person, place, month, year, and situation. Patient is able to give a clear and coherent history. No signs of aphasia or neglect Cranial Nerves: II: Visual Fields are full. Pupils are equal, round, and reactive to light.   III,IV, VI: EOMI without ptosis or diploplia. Patient does have rightward beating nystagmus in primary gaze, right horizontal gaze, and upward gaze that improves in leftward gaze (with reported improvement in room spinning while looking to  the left). Left lateropulsion noted on closing/opening eyes with saccadic movements to correct to midgaze. At times medial deviation of the right eye is observed.  V: Facial sensation is intact and symmetric to light touch VII: Face is symmetric resting and with movement VIII: Hearing is intact to voice, patient endorses 10-15% decrease in bone conduction hearing to the left ear compared to the right X: Palate elevates symmetrically XI: Shoulder shrug is symmetric. XII: Tongue protrudes midline  Motor: Tone is normal. Bulk is normal. 5/5 strength was present in all four extremities.  Sensory: Sensation is symmetric to light touch and temperature in the arms and legs (resolved paraesthesias). No extinction to DSS present.  Plantars: Toes are downgoing bilaterally.  Cerebellar: FNF and HKS are intact bilaterally  Labs/Imaging/Neurodiagnostic studies  CBC:  Recent Labs  Lab 03/18/23 0724 03/18/23 0916  WBC 8.6  --   NEUTROABS 3.8  --   HGB 14.8 16.3  HCT 43.1 48.0  MCV 86.9  --   PLT 287  --    Basic Metabolic Panel:  Lab Results  Component Value Date   NA 139 03/18/2023   K 3.4 (L) 03/18/2023   CO2 22 03/18/2023   GLUCOSE 124 (H) 03/18/2023   BUN 6 03/18/2023   CREATININE 0.80 03/18/2023   CALCIUM 9.1 03/18/2023   GFRNONAA >60 03/18/2023   GFRAA 133 02/11/2020   Lipid Panel:  Lab Results  Component Value Date   LDLCALC 125 (H) 02/11/2020   HgbA1c:  Lab Results  Component Value Date   HGBA1C 5.3 02/11/2020   Urine Drug Screen: No results found for: "LABOPIA", "COCAINSCRNUR", "LABBENZ", "AMPHETMU", "THCU", "LABBARB"  Alcohol Level     Component Value Date/Time   ETH <10 03/18/2023 0724   INR  Lab Results  Component Value Date   INR 1.0 03/18/2023   APTT  Lab Results  Component Value Date   APTT 27 03/18/2023   AED levels: No results found for: "PHENYTOIN", "ZONISAMIDE", "LAMOTRIGINE", "LEVETIRACETA"  CT Head without contrast(Personally reviewed): No  acute intracranial abnormality  MRI Brain without contrast (Personally reviewed): Normal examination.  No abnormality seen to explain the clinical presentation.   MRI cervical spine (Personally reviewed): 1.  Normal appearance of the cervical spinal cord. 2.  Mild degenerative changes at the cervical spine, most pronounced at C6-C7 where there is a mild left and mild-moderate right foraminal stenosis. 3.  No canal stenosis at any level.  ASSESSMENT   Curtis Grimes is a 38 y.o. male  has a past medical history of Allergy, Depression, Dysphagia, Eosinophilic esophagitis, and Meningitis. as well as vertigo about 11 years ago for one week associated with an inner ear infection who presents to the ED 03/18/23 for evaluation of waking up with a pounding left-sided headache, left lower extremity and left fingertip numbness and tingling, with associated incoordinated gait this morning at 05:00.  Once in the ED, the patient  began to experience a room spinning sensation.  MRI brain negative for brainstem infarction to explain acute dizziness with incoordination and left-sided sensory complaints.  On neurology evaluation, the patient endorses resolution of headache and presenting sensory disturbances as well as improvement in his room spinning sensation with meclizine and antiemetic administration.  Patient has rightward beating nystagmus in primary, rightward, and upward gaze with improvement in left horizontal gaze.  Concern for MRI negative stroke -- history/examination do not seem very consistent with meningitis.  However would consider lumbar puncture as part of stroke workup if vessel imaging looks concerning for vasculitis, holding off on antiplatelet agents pending CTA head and neck  RECOMMENDATIONS  - CT angio head and neck ordered - HgbA1c, fasting lipid panel, UA, UDS, hypercoag panel, ANA - Frequent neuro checks - Echocardiogram - Prophylactic therapy- Antiplatelet med: Aspirin - dose 325mg  PO or  300mg  PR and clopidogrel 300 mg load followed by 75 mg PO daily to be ordered pending CTA head and neck results - Risk factor modification - Telemetry monitoring - PT consult, OT consult - Stroke team to follow ______________________________________________________________________  Signed, Kara Mead, NP [facility]  Attending Neurologist's note:  I personally saw this patient, gathering history, performing a full neurologic examination, reviewing relevant labs, personally reviewing relevant imaging including MRI brain, MRI cervical spine, and formulated the assessment and plan, adding the note above for completeness and clarity to accurately reflect my thoughts   Brooke Dare MD-PhD [facility] [phone] Available 7 AM to 7 PM, outside these hours please contact Neurologist on call listed on AMION

## 2023-03-18 NOTE — ED Notes (Signed)
Patient transported to CT 

## 2023-03-18 NOTE — ED Notes (Signed)
Patient transported to MRI 

## 2023-03-19 ENCOUNTER — Observation Stay (HOSPITAL_COMMUNITY): Payer: BC Managed Care – PPO

## 2023-03-19 DIAGNOSIS — I639 Cerebral infarction, unspecified: Secondary | ICD-10-CM | POA: Diagnosis not present

## 2023-03-19 DIAGNOSIS — I7774 Dissection of vertebral artery: Secondary | ICD-10-CM

## 2023-03-19 DIAGNOSIS — R42 Dizziness and giddiness: Secondary | ICD-10-CM | POA: Diagnosis not present

## 2023-03-19 DIAGNOSIS — I6389 Other cerebral infarction: Secondary | ICD-10-CM

## 2023-03-19 LAB — BASIC METABOLIC PANEL
Anion gap: 14 (ref 5–15)
BUN: 6 mg/dL (ref 6–20)
CO2: 23 mmol/L (ref 22–32)
Calcium: 9.2 mg/dL (ref 8.9–10.3)
Chloride: 100 mmol/L (ref 98–111)
Creatinine, Ser: 0.91 mg/dL (ref 0.61–1.24)
GFR, Estimated: >60 mL/min (ref >60)
Glucose, Bld: 110 mg/dL — ABNORMAL HIGH (ref 70–99)
Potassium: 3.5 mmol/L (ref 3.5–5.1)
Sodium: 137 mmol/L (ref 135–145)

## 2023-03-19 LAB — CBC
HCT: 40.4 % (ref 39.0–52.0)
Hemoglobin: 13.7 g/dL (ref 13.0–17.0)
MCH: 29.3 pg (ref 26.0–34.0)
MCHC: 33.9 g/dL (ref 30.0–36.0)
MCV: 86.5 fL (ref 80.0–100.0)
Platelets: 252 10*3/uL (ref 150–400)
RBC: 4.67 MIL/uL (ref 4.22–5.81)
RDW: 12.7 % (ref 11.5–15.5)
WBC: 11.3 10*3/uL — ABNORMAL HIGH (ref 4.0–10.5)
nRBC: 0 % (ref 0.0–0.2)

## 2023-03-19 LAB — LIPID PANEL
Cholesterol: 195 mg/dL (ref 0–200)
HDL: 46 mg/dL (ref >40)
LDL Cholesterol: 140 mg/dL — ABNORMAL HIGH (ref 0–99)
Total CHOL/HDL Ratio: 4.2 {ratio}
Triglycerides: 46 mg/dL (ref <150)
VLDL: 9 mg/dL (ref 0–40)

## 2023-03-19 LAB — ECHOCARDIOGRAM COMPLETE
AR max vel: 2.6 cm2
AV Area VTI: 2.65 cm2
AV Area mean vel: 2.5 cm2
AV Mean grad: 4 mm[Hg]
AV Peak grad: 6.6 mm[Hg]
Ao pk vel: 1.28 m/s
Area-P 1/2: 3.74 cm2
Calc EF: 62.7 %
Height: 71 in
MV VTI: 3.19 cm2
S' Lateral: 3.3 cm
Single Plane A2C EF: 64.8 %
Single Plane A4C EF: 60.3 %
Weight: 2800 [oz_av]

## 2023-03-19 LAB — URINALYSIS, ROUTINE W REFLEX MICROSCOPIC
Bilirubin Urine: NEGATIVE
Glucose, UA: NEGATIVE mg/dL
Hgb urine dipstick: NEGATIVE
Ketones, ur: NEGATIVE mg/dL
Leukocytes,Ua: NEGATIVE
Nitrite: NEGATIVE
Protein, ur: NEGATIVE mg/dL
Specific Gravity, Urine: >1.046 — ABNORMAL HIGH (ref 1.005–1.030)
pH: 7 (ref 5.0–8.0)

## 2023-03-19 LAB — RAPID URINE DRUG SCREEN, HOSP PERFORMED
Amphetamines: NOT DETECTED
Barbiturates: NOT DETECTED
Benzodiazepines: NOT DETECTED
Cocaine: NOT DETECTED
Opiates: NOT DETECTED
Tetrahydrocannabinol: NOT DETECTED

## 2023-03-19 LAB — HIV ANTIBODY (ROUTINE TESTING W REFLEX): HIV Screen 4th Generation wRfx: NONREACTIVE

## 2023-03-19 MED ORDER — ROSUVASTATIN CALCIUM 20 MG PO TABS
40.0000 mg | ORAL_TABLET | Freq: Every day | ORAL | Status: DC
  Administered 2023-03-19: 40 mg via ORAL
  Filled 2023-03-19: qty 2

## 2023-03-19 MED ORDER — OXYCODONE HCL 5 MG PO TABS: 5.0000 mg | ORAL_TABLET | Freq: Four times a day (QID) | ORAL | Status: DC | PRN

## 2023-03-19 MED ORDER — ACETAMINOPHEN 325 MG PO TABS
650.0000 mg | ORAL_TABLET | Freq: Four times a day (QID) | ORAL | Status: DC | PRN
  Administered 2023-03-19 – 2023-03-20 (×2): 650 mg via ORAL
  Filled 2023-03-19 (×2): qty 2

## 2023-03-19 MED ORDER — ONDANSETRON HCL 4 MG PO TABS: 4.0000 mg | ORAL_TABLET | Freq: Four times a day (QID) | ORAL | Status: DC | PRN

## 2023-03-19 MED ORDER — ONDANSETRON HCL 4 MG/2ML IJ SOLN: 4.0000 mg | Freq: Four times a day (QID) | INTRAMUSCULAR | Status: DC | PRN

## 2023-03-19 MED ORDER — ROSUVASTATIN CALCIUM 20 MG PO TABS
20.0000 mg | ORAL_TABLET | Freq: Every day | ORAL | Status: DC
  Administered 2023-03-20: 20 mg via ORAL
  Filled 2023-03-19: qty 1

## 2023-03-19 MED ORDER — SODIUM CHLORIDE 0.9 % IV SOLN
INTRAVENOUS | Status: AC
  Administered 2023-03-19: 40 mL/h via INTRAVENOUS

## 2023-03-19 MED ORDER — IBUPROFEN 200 MG PO TABS
800.0000 mg | ORAL_TABLET | Freq: Once | ORAL | Status: AC
  Administered 2023-03-19: 800 mg via ORAL
  Filled 2023-03-19: qty 4

## 2023-03-19 NOTE — Progress Notes (Addendum)
STROKE TEAM PROGRESS NOTE   BRIEF HPI Curtis Grimes is a 38 y.o. male presents to the ED 03/18/23 for evaluation of waking up with a pounding left-sided headache, left lower extremity and left fingertip numbness and tingling, with associated incoordinated gait this morning at 05:00.  Once in the ED, the patient began to experience a room spinning sensation.  Due to his dizziness and left sensory complaints, an MRI brain and cervical spine imaging were obtained without acute findings. Patient continues to endorse a room spinning sensation in addition to nausea with improved sensory disturbances and resolution of his previous headache -- found to have new nystagmus for which neurology was formally consulted    SIGNIFICANT HOSPITAL EVENTS   INTERIM HISTORY/SUBJECTIVE  Emily at the bedside.  Patient is laying the bed in no apparent distress He states yesterday he developed left side arm and leg tingling and numbness and felt as if his left leg was about to give out while walking.  In the emergency room he developed dizziness where the room was spinning with a headache. He endorses having a headache now however it is improved from yesterday rating as 3-4 out of 10 and it is a dull headache LDL is 140, A1c is 5.4.  Crestor 40 mg was started MRI brain reported as normal, however we will repeat MRI today at noon to evaluate for posterior ischemic stroke   OBJECTIVE  CBC    Component Value Date/Time   WBC 11.3 (H) 03/19/2023 0443   RBC 4.67 03/19/2023 0443   HGB 13.7 03/19/2023 0443   HGB 15.5 02/11/2020 1420   HCT 40.4 03/19/2023 0443   HCT 45.9 02/11/2020 1420   PLT 252 03/19/2023 0443   PLT 295 09/26/2017 1229   MCV 86.5 03/19/2023 0443   MCV 87 02/11/2020 1420   MCH 29.3 03/19/2023 0443   MCHC 33.9 03/19/2023 0443   RDW 12.7 03/19/2023 0443   RDW 12.4 02/11/2020 1420   LYMPHSABS 3.3 03/18/2023 0724   LYMPHSABS 2.9 02/11/2020 1420   MONOABS 0.8 03/18/2023 0724   EOSABS 0.6 (H)  03/18/2023 0724   EOSABS 0.6 (H) 02/11/2020 1420   BASOSABS 0.1 03/18/2023 0724   BASOSABS 0.1 02/11/2020 1420    BMET    Component Value Date/Time   NA 137 03/19/2023 0443   NA 141 02/11/2020 1420   K 3.5 03/19/2023 0443   CL 100 03/19/2023 0443   CO2 23 03/19/2023 0443   GLUCOSE 110 (H) 03/19/2023 0443   BUN 6 03/19/2023 0443   BUN 6 02/11/2020 1420   CREATININE 0.91 03/19/2023 0443   CALCIUM 9.2 03/19/2023 0443   GFRNONAA >60 03/19/2023 0443    IMAGING past 24 hours ECHOCARDIOGRAM COMPLETE Result Date: 03/19/2023    ECHOCARDIOGRAM REPORT   Patient Name:   Curtis Grimes Date of Exam: 03/19/2023 Medical Rec #:  [id number]     Height:       71.0 in Accession #:    [id number]    Weight:       175.0 lb Date of Birth:  1985-08-01      BSA:          1.992 m Patient Age:    37 years      BP:           117/90 mmHg Patient Gender: M             HR:           91 bpm.  Exam Location:  Inpatient Procedure: 2D Echo, Cardiac Doppler, Color Doppler and Saline Contrast Bubble            Study Indications:    Stroke  History:        Patient has no prior history of Echocardiogram examinations.                 Signs/Symptoms:Dizziness/Lightheadedness.  Sonographer:    Amy Chionchio Referring Phys: [id number] SRISHTI L BHAGAT IMPRESSIONS  1. Left ventricular ejection fraction, by estimation, is 60 to 65%. The left ventricle has normal function. The left ventricle has no regional wall motion abnormalities. Left ventricular diastolic parameters were normal.  2. Right ventricular systolic function is normal. The right ventricular size is normal. Tricuspid regurgitation signal is inadequate for assessing PA pressure.  3. The mitral valve is normal in structure. Trivial mitral valve regurgitation. No evidence of mitral stenosis.  4. The aortic valve is tricuspid. Aortic valve regurgitation is not visualized. No aortic stenosis is present.  5. The inferior vena cava is normal in size with greater than 50% respiratory  variability, suggesting right atrial pressure of 3 mmHg.  6. Agitated saline contrast bubble study was negative, with no evidence of any interatrial shunt. Conclusion(s)/Recommendation(s): No intracardiac source of embolism detected on this transthoracic study. Consider a transesophageal echocardiogram to exclude cardiac source of embolism if clinically indicated. FINDINGS  Left Ventricle: Left ventricular ejection fraction, by estimation, is 60 to 65%. The left ventricle has normal function. The left ventricle has no regional wall motion abnormalities. The left ventricular internal cavity size was normal in size. There is  no left ventricular hypertrophy. Left ventricular diastolic parameters were normal. Right Ventricle: The right ventricular size is normal. No increase in right ventricular wall thickness. Right ventricular systolic function is normal. Tricuspid regurgitation signal is inadequate for assessing PA pressure. Left Atrium: Left atrial size was normal in size. Right Atrium: Right atrial size was normal in size. Pericardium: There is no evidence of pericardial effusion. Mitral Valve: The mitral valve is normal in structure. Trivial mitral valve regurgitation. No evidence of mitral valve stenosis. MV peak gradient, 3.3 mmHg. The mean mitral valve gradient is 1.0 mmHg. Tricuspid Valve: The tricuspid valve is normal in structure. Tricuspid valve regurgitation is trivial. No evidence of tricuspid stenosis. Aortic Valve: The aortic valve is tricuspid. Aortic valve regurgitation is not visualized. No aortic stenosis is present. Aortic valve mean gradient measures 4.0 mmHg. Aortic valve peak gradient measures 6.6 mmHg. Aortic valve area, by VTI measures 2.65 cm. Pulmonic Valve: The pulmonic valve was normal in structure. Pulmonic valve regurgitation is trivial. No evidence of pulmonic stenosis. Aorta: The aortic root is normal in size and structure. Venous: The inferior vena cava is normal in size with  greater than 50% respiratory variability, suggesting right atrial pressure of 3 mmHg. IAS/Shunts: The atrial septum is grossly normal. Agitated saline contrast was given intravenously to evaluate for intracardiac shunting. Agitated saline contrast bubble study was negative, with no evidence of any interatrial shunt.  LEFT VENTRICLE PLAX 2D LVIDd:         4.60 cm     Diastology LVIDs:         3.30 cm     LV e' medial:    11.80 cm/s LV PW:         0.70 cm     LV E/e' medial:  5.9 LV IVS:        0.80 cm     LV  e' lateral:   16.90 cm/s LVOT diam:     2.00 cm     LV E/e' lateral: 4.1 LV SV:         66 LV SV Index:   33 LVOT Area:     3.14 cm  LV Volumes (MOD) LV vol d, MOD A2C: 69.1 ml LV vol d, MOD A4C: 87.1 ml LV vol s, MOD A2C: 24.3 ml LV vol s, MOD A4C: 34.6 ml LV SV MOD A2C:     44.8 ml LV SV MOD A4C:     87.1 ml LV SV MOD BP:      50.3 ml RIGHT VENTRICLE             IVC RV Basal diam:  2.20 cm     IVC diam: 1.40 cm RV S prime:     11.20 cm/s TAPSE (M-mode): 2.2 cm LEFT ATRIUM             Index        RIGHT ATRIUM           Index LA Vol (A2C):   39.4 ml 19.78 ml/m  RA Area:     14.60 cm LA Vol (A4C):   30.2 ml 15.16 ml/m  RA Volume:   38.40 ml  19.27 ml/m LA Biplane Vol: 37.0 ml 18.57 ml/m  AORTIC VALVE                    PULMONIC VALVE AV Area (Vmax):    2.60 cm     PV Vmax:       1.05 m/s AV Area (Vmean):   2.50 cm     PV Peak grad:  4.4 mmHg AV Area (VTI):     2.65 cm AV Vmax:           128.00 cm/s AV Vmean:          93.300 cm/s AV VTI:            0.248 m AV Peak Grad:      6.6 mmHg AV Mean Grad:      4.0 mmHg LVOT Vmax:         106.00 cm/s LVOT Vmean:        74.100 cm/s LVOT VTI:          0.209 m LVOT/AV VTI ratio: 0.84  AORTA Ao Root diam: 2.80 cm Ao Asc diam:  3.00 cm MITRAL VALVE MV Area (PHT): 3.74 cm    SHUNTS MV Area VTI:   3.19 cm    Systemic VTI:  0.21 m MV Peak grad:  3.3 mmHg    Systemic Diam: 2.00 cm MV Mean grad:  1.0 mmHg MV Vmax:       0.90 m/s MV Vmean:      48.7 cm/s MV Decel Time: 203  msec MV E velocity: 70.00 cm/s MV A velocity: 36.60 cm/s MV E/A ratio:  1.91 Weston Brass MD Electronically signed by Weston Brass MD Signature Date/Time: 03/19/2023/1:01:20 PM    Final    CT ANGIO HEAD NECK W WO CM Result Date: 03/18/2023 CLINICAL DATA:  Vertigo, peripheral, stroke suspected. EXAM: CT ANGIOGRAPHY HEAD AND NECK WITH AND WITHOUT CONTRAST TECHNIQUE: Multidetector CT imaging of the head and neck was performed using the standard protocol during bolus administration of intravenous contrast. Multiplanar CT image reconstructions and MIPs were obtained to evaluate the vascular anatomy. Carotid stenosis measurements (when applicable) are obtained utilizing NASCET criteria, using the distal internal carotid diameter  as the denominator. RADIATION DOSE REDUCTION: This exam was performed according to the departmental dose-optimization program which includes automated exposure control, adjustment of the mA and/or kV according to patient size and/or use of iterative reconstruction technique. CONTRAST:  75mL OMNIPAQUE IOHEXOL 350 MG/ML SOLN COMPARISON:  CT and MRI studies same day FINDINGS: CTA NECK FINDINGS Aortic arch: Normal. No atherosclerotic change or dissection. Normal branching pattern. Right carotid system: Carotid artery widely patent to the bifurcation. Normal carotid bifurcation. Normal cervical ICA. Left carotid system: Left carotid system similarly normal. Vertebral arteries: No proximal subclavian disease. Both vertebral artery origins are widely patent. The right is dominant. Both vessels are patent through the cervical region to the foramen magnum. However, I do think there is narrowing of the lumen of the small left vertebral artery at the C3 and C4 level, being larger above and below that. This raises the possibility of a vertebral dissection. This will be very difficult to establish with certainty based on the small size of the vessel. One could consider performing MRI T1 weighted axial  imaging through this region with and without fat sat, particularly if possibility of vertebral artery dissection is viable based on the clinical presentation. Skeleton: Normal Other neck: No mucosal mass or lymphadenopathy. Left thyroid nodule measuring up to 18 mm. Thyroid ultrasound recommended. Upper chest: Normal Review of the MIP images confirms the above findings CTA HEAD FINDINGS Anterior circulation: Both internal carotid arteries are widely patent through the skull base and siphon regions. No siphon stenosis. The anterior and middle cerebral vessels are patent. No large vessel occlusion or proximal stenosis. No aneurysm or vascular malformation. Posterior circulation: Dominant right vertebral artery is widely patent through the foramen magnum to the basilar artery. Left vertebral artery is a tiny vessel at the foramen magnum in then shows a short segment where it is not definitely patent. The distal left vertebral artery to the basilar is patent, but this could be antegrade or retrograde flow. There is no basilar artery stenosis. Posterior circulation branch vessels show flow. Venous sinuses: Patent and normal. Anatomic variants: None significant. Review of the MIP images confirms the above findings IMPRESSION: 1. Normal carotid bifurcations. 2. Dominant right vertebral artery is widely patent through the foramen magnum to the basilar artery. Left vertebral artery is a tiny vessel. Question if the lumen becomes even more narrow at the C3-C4 level which could possibly indicate a dissection. I also think there could be left vertebral artery abnormality at the foramen magnum and V4 segment on the left. See above discussion. Consider performing T1 weighted axial MR imaging with and without fat saturation. 3. Left thyroid nodule measuring up to 18 mm. Thyroid ultrasound recommended. Electronically Signed   By: Paulina Fusi M.D.   On: 03/18/2023 17:55    Vitals:   03/19/23 1120 03/19/23 1200 03/19/23 1300  03/19/23 1409  BP:  111/78 105/76 (!) 147/97  Pulse: 65 63 62 82  Resp:  16 16   Temp: 97.6 F (36.4 C)  97.6 F (36.4 C) 98.3 F (36.8 C)  TempSrc:   Oral Oral  SpO2: 97% 95% 96% 99%  Weight:      Height:         PHYSICAL EXAM General:  Alert, well-nourished, well-developed patient in no acute distress Psych:  Mood and affect appropriate for situation CV: Regular rate and rhythm on monitor Respiratory:  Regular, unlabored respirations on room air GI: Abdomen soft and nontender   NEURO:  Mental Status: AA&Ox3, patient  is able to give clear and coherent history Speech/Language: speech is without dysarthria or aphasia.  Naming, repetition, fluency, and comprehension intact.  Cranial Nerves:  II: PERRL. Visual fields full.  III, IV, VI: EOMI. Stagner is noted on left gaze appears likely to be central nystagmus eyelids elevate symmetrically.  V: Sensation is intact to light touch and symmetrical to face.  VII: Face is symmetrical resting and smiling VIII: hearing intact to voice. IX, X: Palate elevates symmetrically. Phonation is normal.  UE:[id number] shrug 5/5. XII: tongue is midline without fasciculations. Motor: 5/5 strength to all muscle groups tested.  Tone: is normal and bulk is normal Sensation- Intact to light touch bilaterally. Extinction absent to light touch to DSS.   Coordination: FTN intact bilaterally, HKS: no ataxia in BLE.No drift.  Gait-unsteady gait  Most Recent NIH  1a Level of Conscious.: 0 1b LOC Questions: 0 1c LOC Commands: 0 2 Best Gaze: 0 3 Visual: 0 4 Facial Palsy: 0 5a Motor Arm - left: 0 5b Motor Arm - Right: 0 6a Motor Leg - Left: 0 6b Motor Leg - Right: 0 7 Limb Ataxia: 0 8 Sensory: 0 9 Best Language: 0 10 Dysarthria: 0 11 Extinct. 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  CTA head  & neck no LVO.  Left vertebral artery is a tiny vessel. Question if the lumen becomes even more narrow at the C3-C4 level which could possibly indicate a dissection. MRI negative Repeat MRI today 2D Echo EF 60 to 65%  Hypercoagulable panel pending LDL 140 HgbA1c 5.4 UDS neg VTE prophylaxis -SCDs No antithrombotic prior to admission, now on aspirin 81 mg daily and clopidogrel 75 mg daily for 3 months and then aspirin alone given left VA occlusion. Therapy recommendations:  outpt PT Disposition: Pending  Hyperlipidemia Home meds: None LDL 140, goal < 70 Add Crestor 20 mg Continue statin at discharge  BP management No home meds BP stable BP goal normotensive  Other Stroke Risk Factors   Other Active Problems Depression History of meningitis 10 years ago Eosinophilic esophagitis   Hospital day # 0  Gevena Mart DNP, ACNPC-AG  [facility]  ATTENDING NOTE: I reviewed above note and agree with the assessment and plan. Pt was seen and examined.   No family at the bedside. Pt lying in bed, still complaining of HA at mid back of head 3-4/10 now, better than yesterday 9/10. Still has some dizziness with head motion, but also improved. No more N/V. Denies any head or neck trauma but stated that severe coughing last month due to cold.   On exam, pt is awake, alert, eyes open, orientated to age, place, time. No aphasia, fluent language, following all simple commands. Able to name and repeat. No gaze palsy, tracking bilaterally, visual field full, PERRL, left gaze nystagmus with direction to the left, no nystagums on the right. Head pulse test neg but again left gaze nystagmus. No facial droop. Tongue midline. Bilateral UEs 5/5, no drift. Bilaterally LEs 5/5, no drift. Sensation symmetrical bilaterally but subjective tingling on the left fingertip and left thigh, b/l FTN intact, gait not tested.   Pt symptom consistent with central source, likely small posterior infarct not  seen on MRI. Will repeat MRI today. On DAPT for 3 months given likely left VA occlusion / dissection, could be from severe coughing from last month. Put on  statin given high LDL. Continue PT and OT. Discussed with Dr. Jacqulyn Bath.  For detailed assessment and plan, please refer to above/below as I have made changes wherever appropriate.   Marvel Plan, MD PhD Stroke Neurology 03/19/2023 4:21 PM     To contact Stroke Continuity provider, please refer to [email or web address]. After hours, contact General Neurology

## 2023-03-19 NOTE — Progress Notes (Signed)
Attempted to see but pt in MRI.  TOC following

## 2023-03-19 NOTE — Progress Notes (Signed)
PT Cancellation Note  Patient Details Name: Curtis Grimes MRN: [id number] DOB: 05-Nov-1985   Cancelled Treatment:    Reason Eval/Treat Not Completed: Other (comment)   Noted results of CT angiogram ?vertebral artery dissection. Awaiting neurologist input on results. Will follow.    Jerolyn Center, PT Acute Rehabilitation Services  Office [phone]   Zena Amos 03/19/2023, 9:32 AM

## 2023-03-19 NOTE — Progress Notes (Signed)
SLP Cancellation Note  Patient Details Name: Curtis Grimes MRN: [id number] DOB: February 23, 1986   Cancelled treatment:       Reason Eval/Treat Not Completed: SLP screened. Pt and his family have no acute concerns. No needs identified, will sign off.    Gwynneth Aliment, M.A., CF-SLP Speech Language Pathology, Acute Rehabilitation Services  Secure Chat preferred [phone]  03/19/2023, 3:17 PM

## 2023-03-19 NOTE — Evaluation (Signed)
Physical Therapy Evaluation Patient Details Name: Curtis Grimes MRN: [id number] DOB: 11-09-1985 Today's Date: 03/19/2023  History of Present Illness  38 y.o. male adm 1/19 with past medical history: Allergy, Depression, Dysphagia, Eosinophilic esophagitis, and Meningitis, vertigo about 11 years ago for one week associated with an inner ear infection.  Admitted with complaints of pounding left-sided headache, left lower extremity and left fingertip numbness and tingling, with associated incoordinated gait.  MR Brain and CT Head:No acute intracranial abnormalities.  CTA Neck: possibility of a vertebral  dissection.  Clinical Impression   Pt admitted secondary to problem above with deficits below. PTA patient was independent and living alone. Pt currently requires min assist for ambulation with left lean and drift present. No head turns or higher level challenges attempted due to ?vertebral artery dissection and imbalance with normal gait. Anticipate patient will benefit from PT to address problems listed below.Will continue to follow acutely to maximize functional mobility independence and safety.  Patient denied any vertigo or diplopia during session. Currently recommend OPPT and RW, however may progress to not need RW. Will follow.          If plan is discharge home, recommend the following: A little help with walking and/or transfers;A little help with bathing/dressing/bathroom;Assistance with cooking/housework;Assist for transportation;Help with stairs or ramp for entrance   Can travel by private vehicle        Equipment Recommendations Rolling walker (2 wheels)  Recommendations for Other Services       Functional Status Assessment Patient has had a recent decline in their functional status and demonstrates the ability to make significant improvements in function in a reasonable and predictable amount of time.     Precautions / Restrictions Precautions Precautions:  Fall Restrictions Weight Bearing Restrictions Per Provider Order: No      Mobility  Bed Mobility Overal bed mobility: Modified Independent                  Transfers Overall transfer level: Needs assistance Equipment used: None Transfers: Sit to/from Stand Sit to Stand: Contact guard assist           General transfer comment: denies spinning, but continues to feel off-balance (pulls to the left)    Ambulation/Gait Ambulation/Gait assistance: Min assist Gait Distance (Feet): 100 Feet Assistive device: None Gait Pattern/deviations: Step-through pattern, Drifts right/left, Narrow base of support   Gait velocity interpretation: 1.31 - 2.62 ft/sec, indicative of limited community ambulator   General Gait Details: drifts/leans to his left; cued for incr step width which he maintained for ~10 ft with some incr stability noted; required repeated cues  Stairs            Wheelchair Mobility     Tilt Bed    Modified Rankin (Stroke Patients Only) Modified Rankin (Stroke Patients Only) Pre-Morbid Rankin Score: No symptoms Modified Rankin: Moderately severe disability     Balance Overall balance assessment: Needs assistance Sitting-balance support: Feet unsupported Sitting balance-Leahy Scale: Fair     Standing balance support: Single extremity supported Standing balance-Leahy Scale: Poor                   Standardized Balance Assessment Standardized Balance Assessment :  (deferred at this time due to ?vertebral dissection)           Pertinent Vitals/Pain Pain Assessment Pain Assessment: Faces Faces Pain Scale: Hurts a little bit Pain Location: Head Pain Descriptors / Indicators: Headache Pain Intervention(s): Limited activity within patient's tolerance  Home Living Family/patient expects to be discharged to:: Private residence Living Arrangements: Alone Available Help at Discharge: Family;Available 24 hours/day Type of Home:  Apartment Home Access: Stairs to enter Entrance Stairs-Rails: Right Entrance Stairs-Number of Steps: Flight to a landing then 6 stairs to apartment   Home Layout: One level Home Equipment: None      Prior Function Prior Level of Function : Independent/Modified Independent;Working/employed;Driving               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     Extremity/Trunk Assessment   Upper Extremity Assessment Upper Extremity Assessment: Defer to OT evaluation    Lower Extremity Assessment Lower Extremity Assessment: Overall WFL for tasks assessed    Cervical / Trunk Assessment Cervical / Trunk Assessment: Normal  Communication   Communication Communication: No apparent difficulties  Cognition Arousal: Alert Behavior During Therapy: WFL for tasks assessed/performed Overall Cognitive Status: Within Functional Limits for tasks assessed                                          General Comments      Exercises     Assessment/Plan    PT Assessment Patient needs continued PT services  PT Problem List Decreased activity tolerance;Decreased balance;Decreased mobility;Decreased knowledge of use of DME       PT Treatment Interventions DME instruction;Gait training;Stair training;Functional mobility training;Therapeutic activities;Therapeutic exercise;Balance training;Neuromuscular re-education;Patient/family education    PT Goals (Current goals can be found in the Care Plan section)  Acute Rehab PT Goals Patient Stated Goal: continue to get better PT Goal Formulation: With patient Time For Goal Achievement: 04/02/23 Potential to Achieve Goals: Good    Frequency Min 1X/week     Co-evaluation               AM-PAC PT "6 Clicks" Mobility  Outcome Measure Help needed turning from your back to your side while in a flat bed without using bedrails?: None Help needed moving from lying on your back to sitting on the side of a flat bed without  using bedrails?: None Help needed moving to and from a bed to a chair (including a wheelchair)?: A Little Help needed standing up from a chair using your arms (e.g., wheelchair or bedside chair)?: A Little Help needed to walk in hospital room?: A Little Help needed climbing 3-5 steps with a railing? : A Lot 6 Click Score: 19    End of Session Equipment Utilized During Treatment: Gait belt Activity Tolerance: Patient limited by fatigue;Other (comment) (and feeling more off-balance) Patient left: in bed;with call bell/phone within reach;with bed alarm set;with family/visitor present Nurse Communication: Mobility status PT Visit Diagnosis: Unsteadiness on feet (R26.81);Other abnormalities of gait and mobility (R26.89)    Time: 1515-1530 PT Time Calculation (min) (ACUTE ONLY): 15 min   Charges:   PT Evaluation $PT Eval Low Complexity: 1 Low   PT General Charges $$ ACUTE PT VISIT: 1 Visit          Jerolyn Center, PT Acute Rehabilitation Services  Office [phone]   Zena Amos 03/19/2023, 3:39 PM

## 2023-03-19 NOTE — Evaluation (Signed)
Occupational Therapy Evaluation Patient Details Name: Curtis Grimes MRN: [id number] DOB: 10-28-1985 Today's Date: 03/19/2023   History of Present Illness 38 y.o. male adm 1/19 with past medical history: Allergy, Depression, Dysphagia, Eosinophilic esophagitis, and Meningitis, vertigo about 11 years ago for one week associated with an inner ear infection.  Admitted with complaints of pounding left-sided headache, left lower extremity and left fingertip numbness and tingling, with associated incoordinated gait.  MR Brain and CT Head:No acute intracranial abnormalities.  CTA Neck: possibility of a vertebral  dissection.   Clinical Impression   Patient admitted for the diagnosis above.  PTA he lives alone in a second level apartment.  States he could discharge to his farther's home for a few days/weeks if needed.  His father works from home.  Patient continues to have a spinning sensation when looking to the left, but all other symptoms have resolved.  Limiting activity due to headache and unsteadiness.  PT Consult for vertigo is pending.  OT can follow in the acute setting, bot no post acute OT is anticipated.  In addition, if vertigo resolves, no acute OT would be necessary.         If plan is discharge home, recommend the following: Assist for transportation    Functional Status Assessment  Patient has had a recent decline in their functional status and demonstrates the ability to make significant improvements in function in a reasonable and predictable amount of time.  Equipment Recommendations  None recommended by OT    Recommendations for Other Services       Precautions / Restrictions Precautions Precautions: Fall Restrictions Weight Bearing Restrictions Per Provider Order: No      Mobility Bed Mobility Overal bed mobility: Modified Independent                  Transfers Overall transfer level: Needs assistance   Transfers: Sit to/from Stand, Bed to  chair/wheelchair/BSC Sit to Stand: Contact guard assist     Step pivot transfers: Min assist     General transfer comment: continues to have spinning sensation when looking to the left      Balance Overall balance assessment: Needs assistance Sitting-balance support: Feet unsupported Sitting balance-Leahy Scale: Fair     Standing balance support: Single extremity supported Standing balance-Leahy Scale: Poor                             ADL either performed or assessed with clinical judgement   ADL                       Lower Body Dressing: Contact guard assist;Sit to/from stand   Toilet Transfer: Minimal assistance                   Vision Baseline Vision/History: 1 Wears glasses Patient Visual Report: No change from baseline       Perception Perception: Within Functional Limits       Praxis Praxis: WFL       Pertinent Vitals/Pain Pain Assessment Pain Assessment: Faces Faces Pain Scale: Hurts little more Pain Location: Head Pain Descriptors / Indicators: Headache Pain Intervention(s): Monitored during session     Extremity/Trunk Assessment Upper Extremity Assessment Upper Extremity Assessment: Overall WFL for tasks assessed   Lower Extremity Assessment Lower Extremity Assessment: Defer to PT evaluation   Cervical / Trunk Assessment Cervical / Trunk Assessment: Normal   Communication Communication Communication: No apparent  difficulties   Cognition Arousal: Alert Behavior During Therapy: WFL for tasks assessed/performed Overall Cognitive Status: Within Functional Limits for tasks assessed                                       General Comments   VSS on RA    Exercises     Shoulder Instructions      Home Living Family/patient expects to be discharged to:: Private residence Living Arrangements: Alone Available Help at Discharge: Family;Available 24 hours/day Type of Home: Apartment Home Access:  Stairs to enter Entrance Stairs-Number of Steps: Flight to a landing then 6 stais to apartment Entrance Stairs-Rails: Right Home Layout: One level     Bathroom Shower/Tub: Producer, television/film/video: Standard Bathroom Accessibility: Yes How Accessible: Accessible via walker Home Equipment: None          Prior Functioning/Environment Prior Level of Function : Independent/Modified Independent;Working/employed;Driving               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        OT Problem List:        OT Treatment/Interventions: Balance training    OT Goals(Current goals can be found in the care plan section) Acute Rehab OT Goals Patient Stated Goal: Return home OT Goal Formulation: With patient Time For Goal Achievement: 04/02/23 Potential to Achieve Goals: Good ADL Goals Pt Will Perform Grooming: Independently Pt Will Perform Lower Body Dressing: Independently Pt Will Transfer to Toilet: Independently  OT Frequency:      Co-evaluation              AM-PAC OT "6 Clicks" Daily Activity     Outcome Measure Help from another person eating meals?: None Help from another person taking care of personal grooming?: None Help from another person toileting, which includes using toliet, bedpan, or urinal?: A Little Help from another person bathing (including washing, rinsing, drying)?: A Little Help from another person to put on and taking off regular upper body clothing?: None Help from another person to put on and taking off regular lower body clothing?: A Little 6 Click Score: 21   End of Session Nurse Communication: Mobility status  Activity Tolerance: Patient tolerated treatment well Patient left: in bed;with call bell/phone within reach  OT Visit Diagnosis: Unsteadiness on feet (R26.81)                Time: 0912-0929 OT Time Calculation (min): 17 min Charges:  OT General Charges $OT Visit: 1 Visit OT Evaluation $OT Eval Moderate Complexity: 1  Mod  03/19/2023  RP, OTR/L  Acute Rehabilitation Services  Office:  [phone]   Suzanna Obey 03/19/2023, 9:39 AM

## 2023-03-19 NOTE — Plan of Care (Signed)
  Problem: Education: Goal: Knowledge of disease or condition will improve Outcome: Progressing   Problem: Ischemic Stroke/TIA Tissue Perfusion: Goal: Complications of ischemic stroke/TIA will be minimized Outcome: Progressing   Problem: Safety: Goal: Ability to remain free from injury will improve Outcome: Progressing   Problem: Pain Managment: Goal: General experience of comfort will improve and/or be controlled Outcome: Progressing

## 2023-03-19 NOTE — ED Notes (Signed)
Patient attempted to provide urine sample; was unsuccessful stating 'I tried, I just couldn't go' Patient aware urine sample is needed for testing.

## 2023-03-19 NOTE — Progress Notes (Signed)
Received from ED via stretcher, oriented to room and surroundings.

## 2023-03-19 NOTE — Progress Notes (Signed)
PROGRESS NOTE    Curtis Grimes  ZOX:[id number] DOB: [birth year] DOA: 03/18/2023 PCP: Curtis Agee, NP   Brief Narrative:  HPI: Curtis Grimes is a 38 y.o. male with medical history significant of depression, anxiety, allergic rhinitis, eosinophilic esophagitis and meningitis in the past who has had also vertigo about 11 years ago apparently lasting up to a week and also some in the ER infections in the past presented to the ER today with left-sided headache that he woke up with this morning.  He was pounding apparently.  Also associated with the left lower extremity and left fingertip numbness and tingling as well as incoordinated gait early in the morning.  Symptoms have improved.  In the ER however he started having vertigo.  Also having left sensory complaints.  He has been extensively worked up by neurology including CT head, MRI of the brain and cervical spine all within normal.  CT angiogram of the brain and neck showed possible vertebral artery dissection.  Neurology has recommended medical admission and they will follow for any further recommendations.  Patient is otherwise stable with no new complaints.   Assessment & Plan:   Principal Problem:   Vertigo Active Problems:   Other allergic rhinitis   Hypokalemia  #1 vertigo and headache: Cause is unclear.  Neuro suspected vertebral artery dissection.  Patient overall feels better but still with some dizziness he says.  Neuro has ordered repeat MRI for concern of possible bleed.  Stroke.  Patient needs to be seen by PT OT.  Will need to stay overnight for all of those things to be accomplished.   #2 hypokalemia: Resolved   #3 history of allergies: No ongoing symptoms.  Continue to monitor  DVT prophylaxis: SCDs Start: 03/19/23 0022   Code Status: Full Code  Family Communication:  None present at bedside.  Plan of care discussed with patient in length and he/she verbalized understanding and agreed with it.  Status is:  Observation The patient will require care spanning > 2 midnights and should be moved to inpatient because: Needs MRI and PT OT evaluation.   Estimated body mass index is 24.41 kg/m as calculated from the following:   Height as of this encounter: 5\' 11"  (1.803 m).   Weight as of this encounter: 79.4 kg.    Nutritional Assessment: Body mass index is 24.41 kg/m.Marland Kitchen Seen by dietician.  I agree with the assessment and plan as outlined below: Nutrition Status:        . Skin Assessment: I have examined the patient's skin and I agree with the wound assessment as performed by the wound care RN as outlined below:    Consultants:  Neurology  Procedures:  None  Antimicrobials:  Anti-infectives (From admission, onward)    None         Subjective: Seen and examined, overall feels better but still with some dizziness.  Did not have any nystagmus during my evaluation.  No other complaint.  Objective: Vitals:   03/19/23 0800 03/19/23 1000 03/19/23 1120 03/19/23 1200  BP: 137/85 (!) 117/90  111/78  Pulse: 60 65 65 63  Resp: 16 18  16   Temp:   97.6 F (36.4 C)   TempSrc:      SpO2: 96% 96% 97% 95%  Weight:      Height:        Intake/Output Summary (Last 24 hours) at 03/19/2023 1317 Last data filed at 03/18/2023 1702 Gross per 24 hour  Intake 250 ml  Output --  Net 250 ml   Filed Weights   03/18/23 0713 03/18/23 2031  Weight: 79.4 kg 79.4 kg    Examination:  General exam: Appears calm and comfortable  Respiratory system: Clear to auscultation. Respiratory effort normal. Cardiovascular system: S1 & S2 heard, RRR. No JVD, murmurs, rubs, gallops or clicks. No pedal edema. Gastrointestinal system: Abdomen is nondistended, soft and nontender. No organomegaly or masses felt. Normal bowel sounds heard. Central nervous system: Alert and oriented. No focal neurological deficits. Extremities: Symmetric 5 x 5 power. Skin: No rashes, lesions or ulcers Psychiatry: Judgement  and insight appear normal. Mood & affect appropriate.    Data Reviewed: I have personally reviewed following labs and imaging studies  CBC: Recent Labs  Lab 03/18/23 0724 03/18/23 0916 03/19/23 0443  WBC 8.6  --  11.3*  NEUTROABS 3.8  --   --   HGB 14.8 16.3 13.7  HCT 43.1 48.0 40.4  MCV 86.9  --  86.5  PLT 287  --  252   Basic Metabolic Panel: Recent Labs  Lab 03/18/23 0724 03/18/23 0916 03/19/23 0443  NA 137 139 137  K 3.5 3.4* 3.5  CL 105 104 100  CO2 22  --  23  GLUCOSE 138* 124* 110*  BUN 5* 6 6  CREATININE 0.95 0.80 0.91  CALCIUM 9.1  --  9.2   GFR: Estimated Creatinine Clearance: 118.4 mL/min (by C-G formula based on SCr of 0.91 mg/dL). Liver Function Tests: Recent Labs  Lab 03/18/23 0724  AST 22  ALT 21  ALKPHOS 71  BILITOT 0.7  PROT 7.1  ALBUMIN 3.9   No results for input(s): "LIPASE", "AMYLASE" in the last 168 hours. No results for input(s): "AMMONIA" in the last 168 hours. Coagulation Profile: Recent Labs  Lab 03/18/23 0724  INR 1.0   Cardiac Enzymes: No results for input(s): "CKTOTAL", "CKMB", "CKMBINDEX", "TROPONINI" in the last 168 hours. BNP (last 3 results) No results for input(s): "PROBNP" in the last 8760 hours. HbA1C: Recent Labs    03/18/23 1717  HGBA1C 5.4   CBG: Recent Labs  Lab 03/18/23 0715  GLUCAP 121*   Lipid Profile: Recent Labs    03/19/23 0443  CHOL 195  HDL 46  LDLCALC 140*  TRIG 46  CHOLHDL 4.2   Thyroid Function Tests: No results for input(s): "TSH", "T4TOTAL", "FREET4", "T3FREE", "THYROIDAB" in the last 72 hours. Anemia Panel: No results for input(s): "VITAMINB12", "FOLATE", "FERRITIN", "TIBC", "IRON", "RETICCTPCT" in the last 72 hours. Sepsis Labs: No results for input(s): "PROCALCITON", "LATICACIDVEN" in the last 168 hours.  No results found for this or any previous visit (from the past 240 hours).   Radiology Studies: ECHOCARDIOGRAM COMPLETE Result Date: 03/19/2023    ECHOCARDIOGRAM REPORT    Patient Name:   Curtis Grimes Date of Exam: 03/19/2023 Medical Rec #:  [id number]     Height:       71.0 in Accession #:    [id number]    Weight:       175.0 lb Date of Birth:  September 05, 1985      BSA:          1.992 m Patient Age:    37 years      BP:           117/90 mmHg Patient Gender: M             HR:           91 bpm. Exam Location:  Inpatient Procedure: 2D Echo, Cardiac  Doppler, Color Doppler and Saline Contrast Bubble            Study Indications:    Stroke  History:        Patient has no prior history of Echocardiogram examinations.                 Signs/Symptoms:Dizziness/Lightheadedness.  Sonographer:    Amy Chionchio Referring Phys: [id number] SRISHTI L BHAGAT IMPRESSIONS  1. Left ventricular ejection fraction, by estimation, is 60 to 65%. The left ventricle has normal function. The left ventricle has no regional wall motion abnormalities. Left ventricular diastolic parameters were normal.  2. Right ventricular systolic function is normal. The right ventricular size is normal. Tricuspid regurgitation signal is inadequate for assessing PA pressure.  3. The mitral valve is normal in structure. Trivial mitral valve regurgitation. No evidence of mitral stenosis.  4. The aortic valve is tricuspid. Aortic valve regurgitation is not visualized. No aortic stenosis is present.  5. The inferior vena cava is normal in size with greater than 50% respiratory variability, suggesting right atrial pressure of 3 mmHg.  6. Agitated saline contrast bubble study was negative, with no evidence of any interatrial shunt. Conclusion(s)/Recommendation(s): No intracardiac source of embolism detected on this transthoracic study. Consider a transesophageal echocardiogram to exclude cardiac source of embolism if clinically indicated. FINDINGS  Left Ventricle: Left ventricular ejection fraction, by estimation, is 60 to 65%. The left ventricle has normal function. The left ventricle has no regional wall motion abnormalities. The left  ventricular internal cavity size was normal in size. There is  no left ventricular hypertrophy. Left ventricular diastolic parameters were normal. Right Ventricle: The right ventricular size is normal. No increase in right ventricular wall thickness. Right ventricular systolic function is normal. Tricuspid regurgitation signal is inadequate for assessing PA pressure. Left Atrium: Left atrial size was normal in size. Right Atrium: Right atrial size was normal in size. Pericardium: There is no evidence of pericardial effusion. Mitral Valve: The mitral valve is normal in structure. Trivial mitral valve regurgitation. No evidence of mitral valve stenosis. MV peak gradient, 3.3 mmHg. The mean mitral valve gradient is 1.0 mmHg. Tricuspid Valve: The tricuspid valve is normal in structure. Tricuspid valve regurgitation is trivial. No evidence of tricuspid stenosis. Aortic Valve: The aortic valve is tricuspid. Aortic valve regurgitation is not visualized. No aortic stenosis is present. Aortic valve mean gradient measures 4.0 mmHg. Aortic valve peak gradient measures 6.6 mmHg. Aortic valve area, by VTI measures 2.65 cm. Pulmonic Valve: The pulmonic valve was normal in structure. Pulmonic valve regurgitation is trivial. No evidence of pulmonic stenosis. Aorta: The aortic root is normal in size and structure. Venous: The inferior vena cava is normal in size with greater than 50% respiratory variability, suggesting right atrial pressure of 3 mmHg. IAS/Shunts: The atrial septum is grossly normal. Agitated saline contrast was given intravenously to evaluate for intracardiac shunting. Agitated saline contrast bubble study was negative, with no evidence of any interatrial shunt.  LEFT VENTRICLE PLAX 2D LVIDd:         4.60 cm     Diastology LVIDs:         3.30 cm     LV e' medial:    11.80 cm/s LV PW:         0.70 cm     LV E/e' medial:  5.9 LV IVS:        0.80 cm     LV e' lateral:   16.90 cm/s LVOT diam:  2.00 cm     LV E/e'  lateral: 4.1 LV SV:         66 LV SV Index:   33 LVOT Area:     3.14 cm  LV Volumes (MOD) LV vol d, MOD A2C: 69.1 ml LV vol d, MOD A4C: 87.1 ml LV vol s, MOD A2C: 24.3 ml LV vol s, MOD A4C: 34.6 ml LV SV MOD A2C:     44.8 ml LV SV MOD A4C:     87.1 ml LV SV MOD BP:      50.3 ml RIGHT VENTRICLE             IVC RV Basal diam:  2.20 cm     IVC diam: 1.40 cm RV S prime:     11.20 cm/s TAPSE (M-mode): 2.2 cm LEFT ATRIUM             Index        RIGHT ATRIUM           Index LA Vol (A2C):   39.4 ml 19.78 ml/m  RA Area:     14.60 cm LA Vol (A4C):   30.2 ml 15.16 ml/m  RA Volume:   38.40 ml  19.27 ml/m LA Biplane Vol: 37.0 ml 18.57 ml/m  AORTIC VALVE                    PULMONIC VALVE AV Area (Vmax):    2.60 cm     PV Vmax:       1.05 m/s AV Area (Vmean):   2.50 cm     PV Peak grad:  4.4 mmHg AV Area (VTI):     2.65 cm AV Vmax:           128.00 cm/s AV Vmean:          93.300 cm/s AV VTI:            0.248 m AV Peak Grad:      6.6 mmHg AV Mean Grad:      4.0 mmHg LVOT Vmax:         106.00 cm/s LVOT Vmean:        74.100 cm/s LVOT VTI:          0.209 m LVOT/AV VTI ratio: 0.84  AORTA Ao Root diam: 2.80 cm Ao Asc diam:  3.00 cm MITRAL VALVE MV Area (PHT): 3.74 cm    SHUNTS MV Area VTI:   3.19 cm    Systemic VTI:  0.21 m MV Peak grad:  3.3 mmHg    Systemic Diam: 2.00 cm MV Mean grad:  1.0 mmHg MV Vmax:       0.90 m/s MV Vmean:      48.7 cm/s MV Decel Time: 203 msec MV E velocity: 70.00 cm/s MV A velocity: 36.60 cm/s MV E/A ratio:  1.91 Weston Brass MD Electronically signed by Weston Brass MD Signature Date/Time: 03/19/2023/1:01:20 PM    Final    CT ANGIO HEAD NECK W WO CM Result Date: 03/18/2023 CLINICAL DATA:  Vertigo, peripheral, stroke suspected. EXAM: CT ANGIOGRAPHY HEAD AND NECK WITH AND WITHOUT CONTRAST TECHNIQUE: Multidetector CT imaging of the head and neck was performed using the standard protocol during bolus administration of intravenous contrast. Multiplanar CT image reconstructions and MIPs were  obtained to evaluate the vascular anatomy. Carotid stenosis measurements (when applicable) are obtained utilizing NASCET criteria, using the distal internal carotid diameter as the denominator. RADIATION DOSE REDUCTION: This exam was performed according  to the departmental dose-optimization program which includes automated exposure control, adjustment of the mA and/or kV according to patient size and/or use of iterative reconstruction technique. CONTRAST:  75mL OMNIPAQUE IOHEXOL 350 MG/ML SOLN COMPARISON:  CT and MRI studies same day FINDINGS: CTA NECK FINDINGS Aortic arch: Normal. No atherosclerotic change or dissection. Normal branching pattern. Right carotid system: Carotid artery widely patent to the bifurcation. Normal carotid bifurcation. Normal cervical ICA. Left carotid system: Left carotid system similarly normal. Vertebral arteries: No proximal subclavian disease. Both vertebral artery origins are widely patent. The right is dominant. Both vessels are patent through the cervical region to the foramen magnum. However, I do think there is narrowing of the lumen of the small left vertebral artery at the C3 and C4 level, being larger above and below that. This raises the possibility of a vertebral dissection. This will be very difficult to establish with certainty based on the small size of the vessel. One could consider performing MRI T1 weighted axial imaging through this region with and without fat sat, particularly if possibility of vertebral artery dissection is viable based on the clinical presentation. Skeleton: Normal Other neck: No mucosal mass or lymphadenopathy. Left thyroid nodule measuring up to 18 mm. Thyroid ultrasound recommended. Upper chest: Normal Review of the MIP images confirms the above findings CTA HEAD FINDINGS Anterior circulation: Both internal carotid arteries are widely patent through the skull base and siphon regions. No siphon stenosis. The anterior and middle cerebral vessels  are patent. No large vessel occlusion or proximal stenosis. No aneurysm or vascular malformation. Posterior circulation: Dominant right vertebral artery is widely patent through the foramen magnum to the basilar artery. Left vertebral artery is a tiny vessel at the foramen magnum in then shows a short segment where it is not definitely patent. The distal left vertebral artery to the basilar is patent, but this could be antegrade or retrograde flow. There is no basilar artery stenosis. Posterior circulation branch vessels show flow. Venous sinuses: Patent and normal. Anatomic variants: None significant. Review of the MIP images confirms the above findings IMPRESSION: 1. Normal carotid bifurcations. 2. Dominant right vertebral artery is widely patent through the foramen magnum to the basilar artery. Left vertebral artery is a tiny vessel. Question if the lumen becomes even more narrow at the C3-C4 level which could possibly indicate a dissection. I also think there could be left vertebral artery abnormality at the foramen magnum and V4 segment on the left. See above discussion. Consider performing T1 weighted axial MR imaging with and without fat saturation. 3. Left thyroid nodule measuring up to 18 mm. Thyroid ultrasound recommended. Electronically Signed   By: Paulina Fusi M.D.   On: 03/18/2023 17:55   MR CERVICAL SPINE WO CONTRAST Result Date: 03/18/2023 CLINICAL DATA:  Myelopathy, acute, cervical spine EXAM: MRI CERVICAL SPINE WITHOUT CONTRAST TECHNIQUE: Multiplanar, multisequence MR imaging of the cervical spine was performed. No intravenous contrast was administered. COMPARISON:  None Available. FINDINGS: Alignment: Physiologic. Vertebrae: No fracture, evidence of discitis, or bone lesion. Cord: Normal signal and morphology. Posterior Fossa, vertebral arteries, paraspinal tissues: Negative. Disc levels: C2-C3: Unremarkable. C3-C4: Minimal disc bulge. No significant foraminal stenosis. No canal stenosis.  C4-C5: Minimal disc bulge. No significant foraminal stenosis. No canal stenosis. C5-C6: Unremarkable. C6-C7: Disc osteophyte complex bilateral uncovertebral spurring. Mild left and mild-moderate right foraminal stenosis. No canal stenosis. C7-T1: Unremarkable. IMPRESSION: 1. Normal appearance of the cervical spinal cord. 2. Mild degenerative changes of the cervical spine, most pronounced at C6-C7  where there is mild left and mild-moderate right foraminal stenosis. 3. No canal stenosis at any level. Electronically Signed   By: Duanne Guess D.O.   On: 03/18/2023 13:39   MR BRAIN WO CONTRAST Result Date: 03/18/2023 CLINICAL DATA:  Headache.  Neurological deficit. EXAM: MRI HEAD WITHOUT CONTRAST TECHNIQUE: Multiplanar, multiecho pulse sequences of the brain and surrounding structures were obtained without intravenous contrast. COMPARISON:  Head CT same day FINDINGS: Brain: The brain has a normal appearance without evidence of malformation, atrophy, old or acute small or large vessel infarction, mass lesion, hemorrhage, hydrocephalus or extra-axial collection. Vascular: Major vessels at the base of the brain show flow. Venous sinuses appear patent. Skull and upper cervical spine: Normal. Sinuses/Orbits: Clear/normal. Other: None significant. IMPRESSION: Normal examination. No abnormality seen to explain the clinical presentation. Electronically Signed   By: Paulina Fusi M.D.   On: 03/18/2023 13:13   CT HEAD WO CONTRAST Result Date: 03/18/2023 CLINICAL DATA:  38 year old male with history of headache. Neurologic deficit. EXAM: CT HEAD WITHOUT CONTRAST TECHNIQUE: Contiguous axial images were obtained from the base of the skull through the vertex without intravenous contrast. RADIATION DOSE REDUCTION: This exam was performed according to the departmental dose-optimization program which includes automated exposure control, adjustment of the mA and/or kV according to patient size and/or use of iterative  reconstruction technique. COMPARISON:  No priors. FINDINGS: Brain: No evidence of acute infarction, hemorrhage, hydrocephalus, extra-axial collection or mass lesion/mass effect. Vascular: No hyperdense vessel or unexpected calcification. Skull: Normal. Negative for fracture or focal lesion. Sinuses/Orbits: No acute finding. Other: None. IMPRESSION: 1. No acute intracranial abnormalities. Electronically Signed   By: Trudie Reed M.D.   On: 03/18/2023 08:07    Scheduled Meds:  aspirin EC  81 mg Oral Daily   clopidogrel  75 mg Oral Daily   rosuvastatin  40 mg Oral Daily   Continuous Infusions:  sodium chloride     sodium chloride 40 mL/hr (03/19/23 0038)     LOS: 0 days   Hughie Closs, MD [facility]  03/19/2023, 1:17 PM   *Please note that this is a verbal dictation therefore any spelling or grammatical errors are due to the "Dragon [employer] One" system interpretation.  Please page via Amion and do not message via secure chat for urgent patient care matters. Secure chat can be used for non urgent patient care matters.  How to contact the [facility] Attending or Consulting provider 7A - 7P or covering provider during after hours 7P -7A, for this patient?  Check the care team in [facility] and look for a) attending/consulting [facility] provider listed and b) the [facility] team listed. Page or secure chat 7A-7P. Log into [email or web address] and use [place]'s universal password to access. If you do not have the password, please contact the hospital operator. Locate the [facility] provider you are looking for under [facility] and page to a number that you can be directly reached. If you still have difficulty reaching the provider, please page the [facility] (Director on Call) for the Hospitalists listed on amion for assistance.

## 2023-03-19 NOTE — ED Notes (Signed)
Pt transported to 3W at this tim

## 2023-03-20 DIAGNOSIS — R42 Dizziness and giddiness: Secondary | ICD-10-CM | POA: Diagnosis not present

## 2023-03-20 DIAGNOSIS — I7774 Dissection of vertebral artery: Secondary | ICD-10-CM | POA: Diagnosis not present

## 2023-03-20 DIAGNOSIS — I639 Cerebral infarction, unspecified: Secondary | ICD-10-CM | POA: Insufficient documentation

## 2023-03-20 LAB — BASIC METABOLIC PANEL
Anion gap: 7 (ref 5–15)
BUN: 8 mg/dL (ref 6–20)
CO2: 23 mmol/L (ref 22–32)
Calcium: 9 mg/dL (ref 8.9–10.3)
Chloride: 107 mmol/L (ref 98–111)
Creatinine, Ser: 0.98 mg/dL (ref 0.61–1.24)
GFR, Estimated: >60 mL/min (ref >60)
Glucose, Bld: 95 mg/dL (ref 70–99)
Potassium: 3.8 mmol/L (ref 3.5–5.1)
Sodium: 137 mmol/L (ref 135–145)

## 2023-03-20 LAB — PROTEIN C ACTIVITY: Protein C Activity: 116 % (ref 73–180)

## 2023-03-20 LAB — PROTEIN S ACTIVITY: Protein S Activity: 94 % (ref 63–140)

## 2023-03-20 LAB — ANA W/REFLEX IF POSITIVE: Anti Nuclear Antibody (ANA): NEGATIVE

## 2023-03-20 LAB — LUPUS ANTICOAGULANT PANEL
DRVVT: 33 s (ref 0.0–47.0)
PTT Lupus Anticoagulant: 29.9 s (ref 0.0–43.5)

## 2023-03-20 LAB — CARDIOLIPIN ANTIBODIES, IGG, IGM, IGA
Anticardiolipin IgA: <9 [APL'U]/mL (ref 0–11)
Anticardiolipin IgG: <9 [GPL'U]/mL (ref 0–14)
Anticardiolipin IgM: 10 [MPL'U]/mL (ref 0–12)

## 2023-03-20 LAB — PROTEIN S, TOTAL: Protein S Ag, Total: 89 % (ref 60–150)

## 2023-03-20 LAB — BETA-2-GLYCOPROTEIN I ABS, IGG/M/A
Beta-2 Glyco I IgG: 10 GPI IgG units (ref 0–20)
Beta-2-Glycoprotein I IgA: <9 GPI IgA units (ref 0–25)
Beta-2-Glycoprotein I IgM: <9 GPI IgM units (ref 0–32)

## 2023-03-20 LAB — PROTEIN C, TOTAL: Protein C, Total: 98 % (ref 60–150)

## 2023-03-20 LAB — HOMOCYSTEINE: Homocysteine: 12.3 umol/L (ref 0.0–14.5)

## 2023-03-20 MED ORDER — ASPIRIN 81 MG PO TBEC: 81.0000 mg | DELAYED_RELEASE_TABLET | Freq: Every day | ORAL | 12 refills | Status: AC

## 2023-03-20 MED ORDER — ROSUVASTATIN CALCIUM 20 MG PO TABS: 20.0000 mg | ORAL_TABLET | Freq: Every day | ORAL | 0 refills | Status: DC

## 2023-03-20 MED ORDER — CLOPIDOGREL BISULFATE 75 MG PO TABS: 75.0000 mg | ORAL_TABLET | Freq: Every day | ORAL | 0 refills | Status: DC

## 2023-03-20 NOTE — Plan of Care (Signed)
  Problem: Education: Goal: Knowledge of disease or condition will improve Outcome: Progressing   Problem: Coping: Goal: Will verbalize positive feelings about self Outcome: Progressing   Problem: Self-Care: Goal: Ability to participate in self-care as condition permits will improve Outcome: Progressing   Problem: Nutrition: Goal: Risk of aspiration will decrease Outcome: Progressing   Problem: Clinical Measurements: Goal: Will remain free from infection Outcome: Progressing   Problem: Activity: Goal: Risk for activity intolerance will decrease Outcome: Progressing   Problem: Nutrition: Goal: Adequate nutrition will be maintained Outcome: Progressing

## 2023-03-20 NOTE — TOC Transition Note (Signed)
Transition of Care [facility]) - Discharge Note   Patient Details  Name: Curtis Grimes MRN: [id number] Date of Birth: 1985-12-22  [facility] CM/SW Contact:  Kermit Balo, RN Phone Number: 03/20/2023, 11:19 AM   Clinical Narrative:     Pt is discharging home with outpatient therapy through [facility]. Information on the AVS. Pt will call to schedule the first appointment. No DME needs.  Pt states his father will check on him at home and provide needed transportation. Pt is from home alone. Pt wasn't taking any medications prior to admission.   Final next level of care: OP Rehab Barriers to Discharge: No Barriers Identified   Patient Goals and CMS Choice            Discharge Placement                       Discharge Plan and Services Additional resources added to the After Visit Summary for                                       Social Drivers of Health (SDOH) Interventions SDOH Screenings   Food Insecurity: No Food Insecurity (03/19/2023)  Housing: Low Risk  (03/19/2023)  Transportation Needs: No Transportation Needs (03/19/2023)  Utilities: Not At Risk (03/19/2023)  Depression (PHQ2-9): Low Risk  (02/11/2020)  Social Connections: Unknown (01/13/2022)   Received from [facility]  Tobacco Use: Low Risk  (03/18/2023)     Readmission Risk Interventions     No data to display

## 2023-03-20 NOTE — Progress Notes (Signed)
Physical Therapy Treatment Patient Details Name: Curtis Grimes MRN: [id number] DOB: [birth year] Today's Date: 03/20/2023   History of Present Illness 38 y.o. male adm 1/19 with past medical history: Allergy, Depression, Dysphagia, Eosinophilic esophagitis, and Meningitis, vertigo about 11 years ago for one week associated with an inner ear infection.  Admitted with complaints of pounding left-sided headache, left lower extremity and left fingertip numbness and tingling, with associated incoordinated gait.  MR Brain and CT Head:No acute intracranial abnormalities.  CTA Neck: possibility of a vertebral  dissection. Repeat MRI Left medullary CVA    PT Comments  Patient continues to improve. States he feels his gait is ~85% back to normal. Scored 21/24 on Dynamic Gait Index. Cancelled request for RW as he no longer needs it. 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     If plan is discharge home, recommend the following: Assistance with cooking/housework;Assist for transportation   Can travel by private vehicle        Equipment Recommendations  None recommended by PT    Recommendations for Other Services       Precautions / Restrictions Precautions Precautions: Fall Restrictions Weight Bearing Restrictions Per Provider Order: No     Mobility  Bed Mobility Overal bed mobility: Independent                  Transfers Overall transfer level: Independent Equipment used: None Transfers: Sit to/from Stand Sit to Stand: Independent   Step pivot transfers: Independent       General transfer comment: denies spinning or feeling pulled to his left    Ambulation/Gait Ambulation/Gait assistance: Contact guard assist Gait Distance (Feet): 300 Feet Assistive device: None Gait Pattern/deviations: Step-through pattern, Narrow base of support, Decreased stride length   Gait velocity interpretation: 1.31 - 2.62 ft/sec, indicative of  limited community ambulator   General Gait Details: no drift (even with head turns); slow pace and can only incremently incr velocity   Stairs Stairs: Yes Stairs assistance: Modified independent (Device/Increase time) Stair Management: One rail Right, Alternating pattern, Forwards Number of Stairs: 6 General stair comments: no issues with LLE weakness; alternating pattern   Wheelchair Mobility     Tilt Bed    Modified Rankin (Stroke Patients Only) Modified Rankin (Stroke Patients Only) Pre-Morbid Rankin Score: No symptoms Modified Rankin: Moderate disability     Balance Overall balance assessment: Needs assistance Sitting-balance support: Feet unsupported Sitting balance-Leahy Scale: Good     Standing balance support: No upper extremity supported Standing balance-Leahy Scale: Good         Tandem Stance - Left Leg: 10 (eyes closed)   Rhomberg - Eyes Closed: 10     Standardized Balance Assessment Standardized Balance Assessment : Dynamic Gait Index (deferred at this time due to ?vertebral dissection)   Dynamic Gait Index Level Surface: Mild Impairment Change in Gait Speed: Mild Impairment Gait with Horizontal Head Turns: Normal Gait with Vertical Head Turns: Normal Gait and Pivot Turn: Normal Step Over Obstacle: Normal Step Around Obstacles: Normal Steps: Mild Impairment Total Score: 21      Cognition Arousal: Alert Behavior During Therapy: WFL for tasks assessed/performed Overall Cognitive Status: Within Functional Limits for tasks assessed                                          Exercises  General Comments        Pertinent Vitals/Pain Pain Assessment Pain Assessment: No/denies pain    Home Living                          Prior Function            PT Goals (current goals can now be found in the care plan section) Acute Rehab PT Goals Patient Stated Goal: continue to get better PT Goal Formulation: With  patient Time For Goal Achievement: 04/02/23 Potential to Achieve Goals: Good Progress towards PT goals: Progressing toward goals    Frequency    Min 1X/week      PT Plan      Co-evaluation              AM-PAC PT "6 Clicks" Mobility   Outcome Measure  Help needed turning from your back to your side while in a flat bed without using bedrails?: None Help needed moving from lying on your back to sitting on the side of a flat bed without using bedrails?: None Help needed moving to and from a bed to a chair (including a wheelchair)?: None Help needed standing up from a chair using your arms (e.g., wheelchair or bedside chair)?: None Help needed to walk in hospital room?: None Help needed climbing 3-5 steps with a railing? : None 6 Click Score: 24    End of Session Equipment Utilized During Treatment: Gait belt Activity Tolerance: Patient tolerated treatment well Patient left: with call bell/phone within reach;in chair Nurse Communication: Mobility status PT Visit Diagnosis: Unsteadiness on feet (R26.81);Other abnormalities of gait and mobility (R26.89)     Time: 1053-1106 PT Time Calculation (min) (ACUTE ONLY): 13 min  Charges:    $Gait Training: 8-22 mins PT General Charges $$ ACUTE PT VISIT: 1 Visit                      Jerolyn Center, PT Acute Rehabilitation Services  Office [phone]    Zena Amos 03/20/2023, 11:15 AM

## 2023-03-20 NOTE — Discharge Summary (Signed)
Physician Discharge Summary  Curtis Grimes OZH:[id number] DOB: 21-Sep-1985 DOA: 03/18/2023  PCP: Janeece Agee, NP  Admit date: 03/18/2023 Discharge date: 03/20/2023    Admitted From: Home Disposition: Home  Recommendations for Outpatient Follow-up:  Follow up with PCP in 1-2 weeks Please obtain BMP/CBC in one week Follow-up with [facility] neurology in 4 weeks Please follow up with your PCP on the following pending results: Unresulted Labs (From admission, onward)     Start     Ordered   03/18/23 1700  Protein C activity  (Hypercoagulable Panel, Comprehensive (PNL))  Once,   URGENT        03/18/23 1700   03/18/23 1700  Protein C, total  (Hypercoagulable Panel, Comprehensive (PNL))  Once,   URGENT        03/18/23 1700   03/18/23 1700  Protein S activity  (Hypercoagulable Panel, Comprehensive (PNL))  Once,   URGENT        03/18/23 1700   03/18/23 1700  Protein S, total  (Hypercoagulable Panel, Comprehensive (PNL))  Once,   URGENT        03/18/23 1700   03/18/23 1700  Lupus anticoagulant panel  (Hypercoagulable Panel, Comprehensive (PNL))  Once,   URGENT        03/18/23 1700   03/18/23 1700  Beta-2-glycoprotein i abs, IgG/M/A  (Hypercoagulable Panel, Comprehensive (PNL))  Once,   URGENT        03/18/23 1700   03/18/23 1700  Homocysteine, serum  (Hypercoagulable Panel, Comprehensive (PNL))  Once,   URGENT        03/18/23 1700   03/18/23 1700  Factor 5 leiden  (Hypercoagulable Panel, Comprehensive (PNL))  Once,   URGENT        03/18/23 1700   03/18/23 1700  Prothrombin gene mutation  (Hypercoagulable Panel, Comprehensive (PNL))  Once,   URGENT        03/18/23 1700   03/18/23 1700  Cardiolipin antibodies, IgG, IgM, IgA  (Hypercoagulable Panel, Comprehensive (PNL))  Once,   URGENT        03/18/23 1700              Home Health: None Equipment/Devices: None  Discharge Condition: Stable CODE STATUS: Full code Diet recommendation: Cardiac  Subjective: Seen and examined, feels  better.  No complaints.  Dizziness is resolved.  He is ready to go home.  Brief/Interim Summary: Curtis Grimes is a 38 y.o. male with medical history significant of depression, anxiety, allergic rhinitis, eosinophilic esophagitis and meningitis in the past who has had also vertigo about 11 years ago apparently lasting up to a week and also some in the ER infections in the past presented to the ER today with left-sided headache that he woke up with this morning.  He was pounding apparently.  Also associated with the left lower extremity and left fingertip numbness and tingling as well as incoordinated gait early in the morning.  Symptoms have improved.  In the ER however he started having vertigo.  Also having left sensory complaints.  Admitted under hospital service for further workup of headache and vertigo, neuro consulted.  Details below.   #1 vertigo and headache/acute ischemic stroke/hyperlipidemia: Initially because appeared to be unclear, initial MRI and all neurowork-up was negative except the fact that CTA head & neck no LVO.  Left vertebral artery is a tiny vessel. Question if the lumen becomes even more narrow at the C3-C4 level which could possibly indicate a dissection..  Patient was started on  aspirin and Plavix.  However neuro strongly suspected stroke so MRI was repeated and this time it showed acute left medullary infarct, consistent with patient's symptoms.  Patient's symptoms have resolved.  He was assessed by PT OT, they recommended outpatient PT.  He also had hyperlipidemia with LDL of 140, started on Crestor 20 mg.  He was started on aspirin and Plavix, neuro recommended to continue DAPT for 3 months given left VA occlusion and then aspirin alone.  Echo showed normal ejection fraction with no PFO.  He is being discharged in stable condition today.   #2 hypokalemia: Resolved   #3 history of allergies: No ongoing symptoms.  Continue to monitor  Discharge Diagnoses:  Principal  Problem:   Vertigo Active Problems:   Other allergic rhinitis   Hypokalemia   Acute ischemic stroke [facility])    Discharge Instructions  Discharge Instructions     Ambulatory referral to Physical Therapy   Complete by: As directed       Allergies as of 03/20/2023       Reactions   Other Swelling   [place]   Shellfish Allergy Swelling   Beef-derived Drug Products    Thick-it Chicken [employer] [place] [nutritional Supplements]    Chicken         Medication List     STOP taking these medications    ibuprofen 200 MG tablet Commonly known as: ADVIL       TAKE these medications    aspirin EC 81 MG tablet Take 1 tablet (81 mg total) by mouth daily. Swallow whole. Start taking on: March 21, 2023   clopidogrel 75 MG tablet Commonly known as: PLAVIX Take 1 tablet (75 mg total) by mouth daily. Start taking on: March 21, 2023   rosuvastatin 20 MG tablet Commonly known as: CRESTOR Take 1 tablet (20 mg total) by mouth daily. Start taking on: March 21, 2023               Durable Medical Equipment  (From admission, onward)           Start     Ordered   03/20/23 1054  For home use only DME Walker rolling  Once       Question Answer Comment  Walker: With 5 Inch Wheels   Patient needs a walker to treat with the following condition Weakness      03/20/23 1053            Follow-up Information     [facility]. Schedule an appointment as soon as possible for a visit.   Specialty: Rehabilitation Contact information: [address] [place] [address] [phone]        Janeece Agee, NP Follow up in 1 week(s).   Specialty: Nurse Practitioner Contact information: [address] [place] [address] [phone]         [facility] Follow up in 1 month(s).   Contact information: [address]     [address] [place] [address] [phone]                Allergies  Allergen Reactions   Other Swelling    [place]    Shellfish Allergy Swelling   Beef-Derived Drug Products    Thick-It Chicken A [place]ar [Nutritional Supplements]     Chicken     Consultations: Neuro   Procedures/Studies: MR BRAIN WO CONTRAST Result Date: 03/19/2023 CLINICAL DATA:  Stroke, follow up EXAM: MRI HEAD  WITHOUT CONTRAST TECHNIQUE: Multiplanar, multiecho pulse sequences of the brain and surrounding structures were obtained without intravenous contrast. COMPARISON:  Brain MR 03/18/2023 FINDINGS: Brain: Compared to prior exam there is a new focus of diffusion restriction in the left aspect of medulla (series 5, image 54). No mass effect. No mass lesion. No hydrocephalus. Vascular: Normal flow voids. Skull and upper cervical spine: Normal marrow signal. Sinuses/Orbits: No middle or mastoid effusion. Polypoid mucosal thickening in the left maxillary sinus. Orbits are unremarkable. Other: None. IMPRESSION: Acute left medullary infarct. This was not visualized on prior brain MRI dated 03/18/23. No hemorrhage. These results will be called to the ordering clinician or representative by the Radiologist Assistant, and communication documented in the PACS or [employer]. Electronically Signed   By: Lorenza Cambridge M.D.   On: 03/19/2023 17:21   ECHOCARDIOGRAM COMPLETE Result Date: 03/19/2023    ECHOCARDIOGRAM REPORT   Patient Name:   Curtis Grimes Date of Exam: 03/19/2023 Medical Rec #:  [id number]     Height:       71.0 in Accession #:    [id number]    Weight:       175.0 lb Date of Birth:  06-20-1985      BSA:          1.992 m Patient Age:    37 years      BP:           117/90 mmHg Patient Gender: M             HR:           91 bpm. Exam Location:  Inpatient Procedure: 2D Echo, Cardiac Doppler, Color Doppler and Saline Contrast Bubble            Study Indications:    Stroke  History:        Patient has no prior history of Echocardiogram examinations.                  Signs/Symptoms:Dizziness/Lightheadedness.  Sonographer:    Amy Chionchio Referring Phys: [id number] SRISHTI L BHAGAT IMPRESSIONS  1. Left ventricular ejection fraction, by estimation, is 60 to 65%. The left ventricle has normal function. The left ventricle has no regional wall motion abnormalities. Left ventricular diastolic parameters were normal.  2. Right ventricular systolic function is normal. The right ventricular size is normal. Tricuspid regurgitation signal is inadequate for assessing PA pressure.  3. The mitral valve is normal in structure. Trivial mitral valve regurgitation. No evidence of mitral stenosis.  4. The aortic valve is tricuspid. Aortic valve regurgitation is not visualized. No aortic stenosis is present.  5. The inferior vena cava is normal in size with greater than 50% respiratory variability, suggesting right atrial pressure of 3 mmHg.  6. Agitated saline contrast bubble study was negative, with no evidence of any interatrial shunt. Conclusion(s)/Recommendation(s): No intracardiac source of embolism detected on this transthoracic study. Consider a transesophageal echocardiogram to exclude cardiac source of embolism if clinically indicated. FINDINGS  Left Ventricle: Left ventricular ejection fraction, by estimation, is 60 to 65%. The left ventricle has normal function. The left ventricle has no regional wall motion abnormalities. The left ventricular internal cavity size was normal in size. There is  no left ventricular hypertrophy. Left ventricular diastolic parameters were normal. Right Ventricle: The right ventricular size is normal. No increase in right ventricular wall thickness. Right ventricular systolic function is normal. Tricuspid regurgitation signal is inadequate for assessing PA pressure. Left Atrium: Left atrial size  was normal in size. Right Atrium: Right atrial size was normal in size. Pericardium: There is no evidence of pericardial effusion. Mitral Valve: The mitral valve is  normal in structure. Trivial mitral valve regurgitation. No evidence of mitral valve stenosis. MV peak gradient, 3.3 mmHg. The mean mitral valve gradient is 1.0 mmHg. Tricuspid Valve: The tricuspid valve is normal in structure. Tricuspid valve regurgitation is trivial. No evidence of tricuspid stenosis. Aortic Valve: The aortic valve is tricuspid. Aortic valve regurgitation is not visualized. No aortic stenosis is present. Aortic valve mean gradient measures 4.0 mmHg. Aortic valve peak gradient measures 6.6 mmHg. Aortic valve area, by VTI measures 2.65 cm. Pulmonic Valve: The pulmonic valve was normal in structure. Pulmonic valve regurgitation is trivial. No evidence of pulmonic stenosis. Aorta: The aortic root is normal in size and structure. Venous: The inferior vena cava is normal in size with greater than 50% respiratory variability, suggesting right atrial pressure of 3 mmHg. IAS/Shunts: The atrial septum is grossly normal. Agitated saline contrast was given intravenously to evaluate for intracardiac shunting. Agitated saline contrast bubble study was negative, with no evidence of any interatrial shunt.  LEFT VENTRICLE PLAX 2D LVIDd:         4.60 cm     Diastology LVIDs:         3.30 cm     LV e' medial:    11.80 cm/s LV PW:         0.70 cm     LV E/e' medial:  5.9 LV IVS:        0.80 cm     LV e' lateral:   16.90 cm/s LVOT diam:     2.00 cm     LV E/e' lateral: 4.1 LV SV:         66 LV SV Index:   33 LVOT Area:     3.14 cm  LV Volumes (MOD) LV vol d, MOD A2C: 69.1 ml LV vol d, MOD A4C: 87.1 ml LV vol s, MOD A2C: 24.3 ml LV vol s, MOD A4C: 34.6 ml LV SV MOD A2C:     44.8 ml LV SV MOD A4C:     87.1 ml LV SV MOD BP:      50.3 ml RIGHT VENTRICLE             IVC RV Basal diam:  2.20 cm     IVC diam: 1.40 cm RV S prime:     11.20 cm/s TAPSE (M-mode): 2.2 cm LEFT ATRIUM             Index        RIGHT ATRIUM           Index LA Vol (A2C):   39.4 ml 19.78 ml/m  RA Area:     14.60 cm LA Vol (A4C):   30.2 ml 15.16  ml/m  RA Volume:   38.40 ml  19.27 ml/m LA Biplane Vol: 37.0 ml 18.57 ml/m  AORTIC VALVE                    PULMONIC VALVE AV Area (Vmax):    2.60 cm     PV Vmax:       1.05 m/s AV Area (Vmean):   2.50 cm     PV Peak grad:  4.4 mmHg AV Area (VTI):     2.65 cm AV Vmax:           128.00 cm/s AV Vmean:  93.300 cm/s AV VTI:            0.248 m AV Peak Grad:      6.6 mmHg AV Mean Grad:      4.0 mmHg LVOT Vmax:         106.00 cm/s LVOT Vmean:        74.100 cm/s LVOT VTI:          0.209 m LVOT/AV VTI ratio: 0.84  AORTA Ao Root diam: 2.80 cm Ao Asc diam:  3.00 cm MITRAL VALVE MV Area (PHT): 3.74 cm    SHUNTS MV Area VTI:   3.19 cm    Systemic VTI:  0.21 m MV Peak grad:  3.3 mmHg    Systemic Diam: 2.00 cm MV Mean grad:  1.0 mmHg MV Vmax:       0.90 m/s MV Vmean:      48.7 cm/s MV Decel Time: 203 msec MV E velocity: 70.00 cm/s MV A velocity: 36.60 cm/s MV E/A ratio:  1.91 Weston Brass MD Electronically signed by Weston Brass MD Signature Date/Time: 03/19/2023/1:01:20 PM    Final    CT ANGIO HEAD NECK W WO CM Result Date: 03/18/2023 CLINICAL DATA:  Vertigo, peripheral, stroke suspected. EXAM: CT ANGIOGRAPHY HEAD AND NECK WITH AND WITHOUT CONTRAST TECHNIQUE: Multidetector CT imaging of the head and neck was performed using the standard protocol during bolus administration of intravenous contrast. Multiplanar CT image reconstructions and MIPs were obtained to evaluate the vascular anatomy. Carotid stenosis measurements (when applicable) are obtained utilizing NASCET criteria, using the distal internal carotid diameter as the denominator. RADIATION DOSE REDUCTION: This exam was performed according to the departmental dose-optimization program which includes automated exposure control, adjustment of the mA and/or kV according to patient size and/or use of iterative reconstruction technique. CONTRAST:  75mL OMNIPAQUE IOHEXOL 350 MG/ML SOLN COMPARISON:  CT and MRI studies same day FINDINGS: CTA NECK FINDINGS  Aortic arch: Normal. No atherosclerotic change or dissection. Normal branching pattern. Right carotid system: Carotid artery widely patent to the bifurcation. Normal carotid bifurcation. Normal cervical ICA. Left carotid system: Left carotid system similarly normal. Vertebral arteries: No proximal subclavian disease. Both vertebral artery origins are widely patent. The right is dominant. Both vessels are patent through the cervical region to the foramen magnum. However, I do think there is narrowing of the lumen of the small left vertebral artery at the C3 and C4 level, being larger above and below that. This raises the possibility of a vertebral dissection. This will be very difficult to establish with certainty based on the small size of the vessel. One could consider performing MRI T1 weighted axial imaging through this region with and without fat sat, particularly if possibility of vertebral artery dissection is viable based on the clinical presentation. Skeleton: Normal Other neck: No mucosal mass or lymphadenopathy. Left thyroid nodule measuring up to 18 mm. Thyroid ultrasound recommended. Upper chest: Normal Review of the MIP images confirms the above findings CTA HEAD FINDINGS Anterior circulation: Both internal carotid arteries are widely patent through the skull base and siphon regions. No siphon stenosis. The anterior and middle cerebral vessels are patent. No large vessel occlusion or proximal stenosis. No aneurysm or vascular malformation. Posterior circulation: Dominant right vertebral artery is widely patent through the foramen magnum to the basilar artery. Left vertebral artery is a tiny vessel at the foramen magnum in then shows a short segment where it is not definitely patent. The distal left vertebral artery to the basilar is  patent, but this could be antegrade or retrograde flow. There is no basilar artery stenosis. Posterior circulation branch vessels show flow. Venous sinuses: Patent and  normal. Anatomic variants: None significant. Review of the MIP images confirms the above findings IMPRESSION: 1. Normal carotid bifurcations. 2. Dominant right vertebral artery is widely patent through the foramen magnum to the basilar artery. Left vertebral artery is a tiny vessel. Question if the lumen becomes even more narrow at the C3-C4 level which could possibly indicate a dissection. I also think there could be left vertebral artery abnormality at the foramen magnum and V4 segment on the left. See above discussion. Consider performing T1 weighted axial MR imaging with and without fat saturation. 3. Left thyroid nodule measuring up to 18 mm. Thyroid ultrasound recommended. Electronically Signed   By: Paulina Fusi M.D.   On: 03/18/2023 17:55   MR CERVICAL SPINE WO CONTRAST Result Date: 03/18/2023 CLINICAL DATA:  Myelopathy, acute, cervical spine EXAM: MRI CERVICAL SPINE WITHOUT CONTRAST TECHNIQUE: Multiplanar, multisequence MR imaging of the cervical spine was performed. No intravenous contrast was administered. COMPARISON:  None Available. FINDINGS: Alignment: Physiologic. Vertebrae: No fracture, evidence of discitis, or bone lesion. Cord: Normal signal and morphology. Posterior Fossa, vertebral arteries, paraspinal tissues: Negative. Disc levels: C2-C3: Unremarkable. C3-C4: Minimal disc bulge. No significant foraminal stenosis. No canal stenosis. C4-C5: Minimal disc bulge. No significant foraminal stenosis. No canal stenosis. C5-C6: Unremarkable. C6-C7: Disc osteophyte complex bilateral uncovertebral spurring. Mild left and mild-moderate right foraminal stenosis. No canal stenosis. C7-T1: Unremarkable. IMPRESSION: 1. Normal appearance of the cervical spinal cord. 2. Mild degenerative changes of the cervical spine, most pronounced at C6-C7 where there is mild left and mild-moderate right foraminal stenosis. 3. No canal stenosis at any level. Electronically Signed   By: Duanne Guess D.O.   On: 03/18/2023  13:39   MR BRAIN WO CONTRAST Result Date: 03/18/2023 CLINICAL DATA:  Headache.  Neurological deficit. EXAM: MRI HEAD WITHOUT CONTRAST TECHNIQUE: Multiplanar, multiecho pulse sequences of the brain and surrounding structures were obtained without intravenous contrast. COMPARISON:  Head CT same day FINDINGS: Brain: The brain has a normal appearance without evidence of malformation, atrophy, old or acute small or large vessel infarction, mass lesion, hemorrhage, hydrocephalus or extra-axial collection. Vascular: Major vessels at the base of the brain show flow. Venous sinuses appear patent. Skull and upper cervical spine: Normal. Sinuses/Orbits: Clear/normal. Other: None significant. IMPRESSION: Normal examination. No abnormality seen to explain the clinical presentation. Electronically Signed   By: Paulina Fusi M.D.   On: 03/18/2023 13:13   CT HEAD WO CONTRAST Result Date: 03/18/2023 CLINICAL DATA:  38 year old male with history of headache. Neurologic deficit. EXAM: CT HEAD WITHOUT CONTRAST TECHNIQUE: Contiguous axial images were obtained from the base of the skull through the vertex without intravenous contrast. RADIATION DOSE REDUCTION: This exam was performed according to the departmental dose-optimization program which includes automated exposure control, adjustment of the mA and/or kV according to patient size and/or use of iterative reconstruction technique. COMPARISON:  No priors. FINDINGS: Brain: No evidence of acute infarction, hemorrhage, hydrocephalus, extra-axial collection or mass lesion/mass effect. Vascular: No hyperdense vessel or unexpected calcification. Skull: Normal. Negative for fracture or focal lesion. Sinuses/Orbits: No acute finding. Other: None. IMPRESSION: 1. No acute intracranial abnormalities. Electronically Signed   By: Trudie Reed M.D.   On: 03/18/2023 08:07     Discharge Exam: Vitals:   03/20/23 0400 03/20/23 0722  BP: 115/86 125/85  Pulse: (!) 59 (!) 56  Resp: 16  17  Temp: 98.5 F (36.9 C) 97.7 F (36.5 C)  SpO2: 99% 99%   Vitals:   03/19/23 2000 03/20/23 0000 03/20/23 0400 03/20/23 0722  BP: (!) 125/90 128/87 115/86 125/85  Pulse: 74 60 (!) 59 (!) 56  Resp: 18 18 16 17   Temp: 98.5 F (36.9 C) 98.4 F (36.9 C) 98.5 F (36.9 C) 97.7 F (36.5 C)  TempSrc: Oral Oral Oral Oral  SpO2: 99% 99% 99% 99%  Weight:      Height:        General: Pt is alert, awake, not in acute distress Cardiovascular: RRR, S1/S2 +, no rubs, no gallops Respiratory: CTA bilaterally, no wheezing, no rhonchi Abdominal: Soft, NT, ND, bowel sounds + Extremities: no edema, no cyanosis    The results of significant diagnostics from this hospitalization (including imaging, microbiology, ancillary and laboratory) are listed below for reference.     Microbiology: No results found for this or any previous visit (from the past 240 hours).   Labs: BNP (last 3 results) No results for input(s): "BNP" in the last 8760 hours. Basic Metabolic Panel: Recent Labs  Lab 03/18/23 0724 03/18/23 0916 03/19/23 0443 03/20/23 0510  NA 137 139 137 137  K 3.5 3.4* 3.5 3.8  CL 105 104 100 107  CO2 22  --  23 23  GLUCOSE 138* 124* 110* 95  BUN 5* 6 6 8   CREATININE 0.95 0.80 0.91 0.98  CALCIUM 9.1  --  9.2 9.0   Liver Function Tests: Recent Labs  Lab 03/18/23 0724  AST 22  ALT 21  ALKPHOS 71  BILITOT 0.7  PROT 7.1  ALBUMIN 3.9   No results for input(s): "LIPASE", "AMYLASE" in the last 168 hours. No results for input(s): "AMMONIA" in the last 168 hours. CBC: Recent Labs  Lab 03/18/23 0724 03/18/23 0916 03/19/23 0443  WBC 8.6  --  11.3*  NEUTROABS 3.8  --   --   HGB 14.8 16.3 13.7  HCT 43.1 48.0 40.4  MCV 86.9  --  86.5  PLT 287  --  252   Cardiac Enzymes: No results for input(s): "CKTOTAL", "CKMB", "CKMBINDEX", "TROPONINI" in the last 168 hours. BNP: Invalid input(s): "POCBNP" CBG: Recent Labs  Lab 03/18/23 0715  GLUCAP 121*   D-Dimer No results  for input(s): "DDIMER" in the last 72 hours. Hgb A1c Recent Labs    03/18/23 1717  HGBA1C 5.4   Lipid Profile Recent Labs    03/19/23 0443  CHOL 195  HDL 46  LDLCALC 140*  TRIG 46  CHOLHDL 4.2   Thyroid function studies No results for input(s): "TSH", "T4TOTAL", "T3FREE", "THYROIDAB" in the last 72 hours.  Invalid input(s): "FREET3" Anemia work up No results for input(s): "VITAMINB12", "FOLATE", "FERRITIN", "TIBC", "IRON", "RETICCTPCT" in the last 72 hours. Urinalysis    Component Value Date/Time   COLORURINE YELLOW 03/19/2023 0456   APPEARANCEUR CLEAR 03/19/2023 0456   LABSPEC >1.046 (H) 03/19/2023 0456   PHURINE 7.0 03/19/2023 0456   GLUCOSEU NEGATIVE 03/19/2023 0456   HGBUR NEGATIVE 03/19/2023 0456   BILIRUBINUR NEGATIVE 03/19/2023 0456   KETONESUR NEGATIVE 03/19/2023 0456   PROTEINUR NEGATIVE 03/19/2023 0456   NITRITE NEGATIVE 03/19/2023 0456   LEUKOCYTESUR NEGATIVE 03/19/2023 0456   Sepsis Labs Recent Labs  Lab 03/18/23 0724 03/19/23 0443  WBC 8.6 11.3*   Microbiology No results found for this or any previous visit (from the past 240 hours).  FURTHER DISCHARGE INSTRUCTIONS:   Get Medicines reviewed and adjusted: Please  take all your medications with you for your next visit with your Primary MD   Laboratory/radiological data: Please request your Primary MD to go over all hospital tests and procedure/radiological results at the follow up, please ask your Primary MD to get all Hospital records sent to his/her office.   In some cases, they will be blood work, cultures and biopsy results pending at the time of your discharge. Please request that your primary care M.D. goes through all the records of your hospital data and follows up on these results.   Also Note the following: If you experience worsening of your admission symptoms, develop shortness of breath, life threatening emergency, suicidal or homicidal thoughts you must seek medical attention  immediately by calling 911 or calling your MD immediately  if symptoms less severe.   You must read complete instructions/literature along with all the possible adverse reactions/side effects for all the Medicines you take and that have been prescribed to you. Take any new Medicines after you have completely understood and accpet all the possible adverse reactions/side effects.    Do not drive when taking Pain medications or sleeping medications (Benzodaizepines)   Do not take more than prescribed Pain, Sleep and Anxiety Medications. It is not advisable to combine anxiety,sleep and pain medications without talking with your primary care practitioner   Special Instructions: If you have smoked or chewed Tobacco  in the last 2 yrs please stop smoking, stop any regular Alcohol  and or any Recreational drug use.   Wear Seat belts while driving.   Please note: You were cared for by a hospitalist during your hospital stay. Once you are discharged, your primary care physician will handle any further medical issues. Please note that NO REFILLS for any discharge medications will be authorized once you are discharged, as it is imperative that you return to your primary care physician (or establish a relationship with a primary care physician if you do not have one) for your post hospital discharge needs so that they can reassess your need for medications and monitor your lab values  Time coordinating discharge: Over 30 minutes  SIGNED:   Hughie Closs, MD  [facility] 03/20/2023, 11:19 AM *Please note that this is a verbal dictation therefore any spelling or grammatical errors are due to the "Dragon Medical One" system interpretation. If 7PM-7AM, please contact night-coverage [email or web address]

## 2023-03-20 NOTE — Progress Notes (Signed)
STROKE TEAM PROGRESS NOTE   BRIEF HPI [name] is a 38 y.o. male presents to the ED 03/18/23 for evaluation of waking up with a pounding left-sided headache, left lower extremity and left fingertip numbness and tingling, with associated incoordinated gait this morning at 05:00.  Once in the ED, the patient began to experience a room spinning sensation.  Due to his dizziness and left sensory complaints, an MRI brain and cervical spine imaging were obtained without acute findings. Patient continues to endorse a room spinning sensation in addition to nausea with improved sensory disturbances and resolution of his previous headache -- found to have new nystagmus for which neurology was formally consulted    SIGNIFICANT HOSPITAL EVENTS   INTERIM HISTORY/SUBJECTIVE No acute event overnight. Symptoms overall improving. PT and OT recommend outpt. MRI repeat showed left medullary small infarct.    OBJECTIVE  CBC    Component Value Date/Time   WBC 11.3 (H) 03/19/2023 0443   RBC 4.67 03/19/2023 0443   HGB 13.7 03/19/2023 0443   HGB 15.5 02/11/2020 1420   HCT 40.4 03/19/2023 0443   HCT 45.9 02/11/2020 1420   PLT 252 03/19/2023 0443   PLT 295 09/26/2017 1229   MCV 86.5 03/19/2023 0443   MCV 87 02/11/2020 1420   MCH 29.3 03/19/2023 0443   MCHC 33.9 03/19/2023 0443   RDW 12.7 03/19/2023 0443   RDW 12.4 02/11/2020 1420   LYMPHSABS 3.3 03/18/2023 0724   LYMPHSABS 2.9 02/11/2020 1420   MONOABS 0.8 03/18/2023 0724   EOSABS 0.6 (H) 03/18/2023 0724   EOSABS 0.6 (H) 02/11/2020 1420   BASOSABS 0.1 03/18/2023 0724   BASOSABS 0.1 02/11/2020 1420    BMET    Component Value Date/Time   NA 137 03/20/2023 0510   NA 141 02/11/2020 1420   K 3.8 03/20/2023 0510   CL 107 03/20/2023 0510   CO2 23 03/20/2023 0510   GLUCOSE 95 03/20/2023 0510   BUN 8 03/20/2023 0510   BUN 6 02/11/2020 1420   CREATININE 0.98 03/20/2023 0510   CALCIUM 9.0 03/20/2023 0510   GFRNONAA >60 03/20/2023 0510     IMAGING past 24 hours MR BRAIN WO CONTRAST Result Date: 03/19/2023 CLINICAL DATA:  Stroke, follow up EXAM: MRI HEAD WITHOUT CONTRAST TECHNIQUE: Multiplanar, multiecho pulse sequences of the brain and surrounding structures were obtained without intravenous contrast. COMPARISON:  Brain MR 03/18/2023 FINDINGS: Brain: Compared to prior exam there is a new focus of diffusion restriction in the left aspect of medulla (series 5, image 54). No mass effect. No mass lesion. No hydrocephalus. Vascular: Normal flow voids. Skull and upper cervical spine: Normal marrow signal. Sinuses/Orbits: No middle or mastoid effusion. Polypoid mucosal thickening in the left maxillary sinus. Orbits are unremarkable. Other: None. IMPRESSION: Acute left medullary infarct. This was not visualized on prior brain MRI dated 03/18/23. No hemorrhage. These results will be called to the ordering clinician or representative by the Radiologist Assistant, and communication documented in the PACS or [employer]. Electronically Signed   By: Lorenza Cambridge M.D.   On: 03/19/2023 17:21    Vitals:   03/19/23 2000 03/20/23 0000 03/20/23 0400 03/20/23 0722  BP: (!) 125/90 128/87 115/86 125/85  Pulse: 74 60 (!) 59 (!) 56  Resp: 18 18 16 17   Temp: 98.5 F (36.9 C) 98.4 F (36.9 C) 98.5 F (36.9 C) 97.7 F (36.5 C)  TempSrc: Oral Oral Oral Oral  SpO2: 99% 99% 99% 99%  Weight:  Height:         PHYSICAL EXAM General:  Alert, well-nourished, well-developed patient in no acute distress Psych:  Mood and affect appropriate for situation CV: Regular rate and rhythm on monitor Respiratory:  Regular, unlabored respirations on room air GI: Abdomen soft and nontender   NEURO:  awake, alert, eyes open, orientated to age, place, time. No aphasia, fluent language, following all simple commands. Able to name and repeat. No gaze palsy, tracking bilaterally, visual field full, PERRL, left gaze nystagmus with direction to the left, no  nystagums on the right. Head pulse test neg but again left gaze nystagmus. No facial droop. Tongue midline. Bilateral UEs 5/5, no drift. Bilaterally LEs 5/5, no drift. Sensation symmetrical bilaterally but subjective tingling on the left fingertip and left thigh, b/l FTN intact, gait not tested.    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  CTA head & neck no LVO.  Left vertebral artery is a tiny vessel. Question if the lumen becomes even more narrow at the C3-C4 level which could possibly indicate a dissection. MRI negative Repeat MRI left small medullary infarct 2D Echo EF 60 to 65%  Hypercoagulable panel pending LDL 140 HgbA1c 5.4 UDS neg VTE prophylaxis -SCDs No antithrombotic prior to admission, now on aspirin 81 mg daily and clopidogrel 75 mg daily for 3 months and then aspirin alone given left VA occlusion. Therapy recommendations:  outpt PT Disposition: Pending  Hyperlipidemia Home meds: None LDL 140, goal < 70 Add Crestor 20 mg Continue statin at discharge  BP management No home meds BP stable BP goal normotensive  Other Stroke Risk Factors   Other Active Problems Depression History of meningitis 10 years ago Eosinophilic esophagitis   Hospital day # 0  Neurology will sign off. Please call with questions. Pt will follow up with stroke clinic NP at [facility] in about 4 weeks. Thanks for the consult.   Marvel Plan, MD PhD Stroke Neurology 03/20/2023 12:50 PM     To contact Stroke Continuity provider, please refer to [email or web address]. After hours, contact General Neurology

## 2023-03-20 NOTE — Progress Notes (Signed)
AVS instructions reviewed with the stoke handbook. Patient questions answered. Ready for discharge, awaiting transportation via private vehicle.

## 2023-03-21 ENCOUNTER — Ambulatory Visit: Payer: BC Managed Care – PPO | Attending: Family Medicine | Admitting: Physical Therapy

## 2023-03-21 VITALS — BP 131/93 | HR 75

## 2023-03-21 DIAGNOSIS — R29818 Other symptoms and signs involving the nervous system: Secondary | ICD-10-CM | POA: Insufficient documentation

## 2023-03-21 DIAGNOSIS — R42 Dizziness and giddiness: Secondary | ICD-10-CM | POA: Diagnosis not present

## 2023-03-21 DIAGNOSIS — I7774 Dissection of vertebral artery: Secondary | ICD-10-CM | POA: Insufficient documentation

## 2023-03-21 NOTE — Therapy (Signed)
OUTPATIENT PHYSICAL THERAPY - NEURO PT EVALUATION ONLY     Patient Name: Curtis Grimes MRN: [id number] DOB:27-Oct-1985, 38 y.o., male Today's Date: 03/21/2023  END OF SESSION:  PT End of Session - 03/21/23 0932     Visit Number 1   eval only   Authorization Type BLUE CROSS BLUE SHIELD    PT Start Time 0930    PT Stop Time 0955   full time not used, eval only   PT Time Calculation (min) 25 min    Activity Tolerance Patient tolerated treatment well    Behavior During Therapy [facility] for tasks assessed/performed             Past Medical History:  Diagnosis Date   Allergy    Phreesia 02/11/2020   Depression    Dysphagia    Eosinophilic esophagitis    Meningitis    Past Surgical History:  Procedure Laterality Date   CAUTERIZE INNER NOSE     childhood   VASECTOMY     Patient Active Problem List   Diagnosis Date Noted   Acute ischemic stroke (HCC) 03/20/2023   Hypokalemia 03/18/2023   Vertigo 03/18/2023   Eosinophilic esophagitis 01/07/2018   Other allergic rhinitis 01/07/2018    PCP: Janeece Agee, NP   REFERRING PROVIDER: Hughie Closs, MD   REFERRING DIAG: I77.74 (ICD-10-CM) - Vertebral artery dissection (HCC) R42 (ICD-10-CM) - Vertigo  THERAPY DIAG:  Other symptoms and signs involving the nervous system  ONSET DATE: 03/20/2023  Rationale for Evaluation and Treatment: Rehabilitation  SUBJECTIVE:   SUBJECTIVE STATEMENT: Was recently hospitalized for a L sided headache and weakness on L side. Acute left medullary infarct was found on MRI brain 03/19/23. This was not visualized on prior brain MRI dated 03/18/23. The only lingering symptom is that L thigh feels a little weaker. Still able to walk around fine. Also has a little bit of a headache. No dizziness. No falls. Reports his boss put him out of work until Monday.   Pt accompanied by: self  PERTINENT HISTORY: PMH: significant of depression, anxiety, allergic rhinitis, eosinophilic esophagitis and  meningitis in the past who has had also vertigo about 11 years ago apparently lasting up to a week   Pt presented to the ER on 03/18/23 with left-sided headache that he woke up with this morning. He was pounding apparently. L arm and L leg was weak. And didn't have much balance on his L side.   vertigo and headache/acute ischemic stroke/hyperlipidemia: Initially because appeared to be unclear, initial MRI and all neurowork-up was negative except the fact that CTA head & neck no LVO.  Left vertebral artery is a tiny vessel. Question if the lumen becomes even more narrow at the C3-C4 level which could possibly indicate a dissection..  Patient was started on aspirin and Plavix.  However neuro strongly suspected stroke so MRI was repeated and this time it showed acute left medullary infarct, consistent with patient's symptoms.  Patient's symptoms have resolved.   PAIN:  Are you having pain? No  Vitals:   03/21/23 0938  BP: (!) 131/93  Pulse: 75     PRECAUTIONS: None  RED FLAGS: None   WEIGHT BEARING RESTRICTIONS: No  FALLS: Has patient fallen in last 6 months? 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   PATIENT GOALS: "Reports he needed to follow-up with PT" Gain strength back in L leg.   OBJECTIVE:  Note: Objective measures were  completed at Evaluation unless otherwise noted.  DIAGNOSTIC FINDINGS: MRI brain 03/19/23: IMPRESSION: Acute left medullary infarct. This was not visualized on prior brain MRI dated 03/18/23. No hemorrhage.  COGNITION: Overall cognitive status: Within functional limits for tasks assessed   SENSATION: Light touch: WFL  COORDINATION:  Heel to shin WNL    LOWER EXTREMITY MMT:   MMT Right eval Left eval  Hip flexion 5 5  Hip abduction    Hip adduction    Hip internal rotation    Hip external rotation    Knee flexion 5 5  Knee extension 5 5  Ankle dorsiflexion 5 5   Ankle plantarflexion    Ankle inversion    Ankle eversion    (Blank rows = not tested)   TRANSFERS: Assistive device utilized: None  Sit to stand: Complete Independence Stand to sit: Complete Independence  GAIT: Gait pattern: WFL and step through pattern Distance walked: Clinic distances  Assistive device utilized: None Level of assistance: Complete Independence   FUNCTIONAL TESTS:  5 times sit to stand: 12.3 seconds no UE support  10 meter walk test: 8.6 seconds with no AD=  3.81 ft/sec, pt reports his gait is back to baseline  SLS:  RLE: 20 seconds (at least), LLE: 20 seconds (at least), but pt reporting needing to concentrate more      [facility] PT Assessment - 03/21/23 0948       Functional Gait  Assessment   Gait assessed  Yes    Gait Level Surface Walks 20 ft in less than 5.5 sec, no assistive devices, good speed, no evidence for imbalance, normal gait pattern, deviates no more than 6 in outside of the 12 in walkway width.    Change in Gait Speed Able to smoothly change walking speed without loss of balance or gait deviation. Deviate no more than 6 in outside of the 12 in walkway width.    Gait with Horizontal Head Turns Performs head turns smoothly with no change in gait. Deviates no more than 6 in outside 12 in walkway width    Gait with Vertical Head Turns Performs head turns with no change in gait. Deviates no more than 6 in outside 12 in walkway width.    Gait and Pivot Turn Pivot turns safely within 3 sec and stops quickly with no loss of balance.    Step Over Obstacle Is able to step over 2 stacked shoe boxes taped together (9 in total height) without changing gait speed. No evidence of imbalance.    Gait with Narrow Base of Support Is able to ambulate for 10 steps heel to toe with no staggering.    Gait with Eyes Closed Walks 20 ft, no assistive devices, good speed, no evidence of imbalance, normal gait pattern, deviates no more than 6 in outside 12 in walkway width.  Ambulates 20 ft in less than 7 sec.    Ambulating Backwards Walks 20 ft, no assistive devices, good speed, no evidence for imbalance, normal gait    Steps Alternating feet, no rail.    Total Score 30    FGA comment: No fall risk  TREATMENT DATE: 03/21/23   Access Code: [email or web address] URL: [email or web address] Date: 03/21/2023 Prepared by: Sherlie Ban  Initiated HEP for L quad strengthening and standing balance on one leg:   Exercises - Single Leg Stance on Foam Pad  - 1 x daily - 7 x weekly - 3 sets - 20-30 hold - Sitting Knee Extension with Resistance (Mirrored)  - 1 x daily - 7 x weekly - 2 sets - 10 reps - Staggered Sit-to-Stand (Mirrored)  - 1 x daily - 7 x weekly - 2 sets - 10 reps  PATIENT EDUCATION: Education details: Clinical findings, HEP, pt does not need PT at this time  Person educated: Patient Education method: Explanation, Verbal cues, and Handouts Education comprehension: verbalized understanding and returned demonstration  HOME EXERCISE PROGRAM:  Access Code: [email or web address] URL: [email or web address] Date: 03/21/2023 Prepared by: Sherlie Ban  Initiated HEP for L quad strengthening and standing balance on one leg:   Exercises - Single Leg Stance on Foam Pad  - 1 x daily - 7 x weekly - 3 sets - 20-30 hold - Sitting Knee Extension with Resistance (Mirrored)  - 1 x daily - 7 x weekly - 2 sets - 10 reps - Staggered Sit-to-Stand (Mirrored)  - 1 x daily - 7 x weekly - 2 sets - 10 reps  GOALS: Goals reviewed with patient? No, N/A pt not being picked up for PT   ASSESSMENT:  CLINICAL IMPRESSION: Patient is a 38 year old male referred to Neuro OPPT for acute CVA.   Acute left medullary infarct was found on MRI brain 03/19/23. Pt's PMH is significant for: depression, anxiety. Pt reports he feels like he  is basically at his baseline, except L quad feels a little weaker. Pt scored a 30/30 on the FGA, indicating pt is not at a fall risk. Pt's gait speed at 3.81 ft/sec, indicates a community ambulator and pt reports he is back to his baseline speed.  Pt with 5/5 strength testing on MMT, but does report some weakness in L quad. Provided pt with HEP for L quad strengthening and SLS on compliant surfaces. Based on outcome measures and pt's functional status, pt does not need skilled PT at this time. Pt in agreement with plan.    CLINICAL DECISION MAKING: Stable/uncomplicated  EVALUATION COMPLEXITY: Low   PLAN:  PT FREQUENCY: one time visit, eval only    Drake Leach, PT, DPT 03/21/2023, 10:59 AM

## 2023-03-23 ENCOUNTER — Ambulatory Visit (INDEPENDENT_AMBULATORY_CARE_PROVIDER_SITE_OTHER): Payer: BC Managed Care – PPO | Admitting: Family Medicine

## 2023-03-23 ENCOUNTER — Encounter (HOSPITAL_BASED_OUTPATIENT_CLINIC_OR_DEPARTMENT_OTHER): Payer: Self-pay | Admitting: Family Medicine

## 2023-03-23 VITALS — BP 132/94 | HR 83 | Ht 71.0 in | Wt 174.4 lb

## 2023-03-23 DIAGNOSIS — E041 Nontoxic single thyroid nodule: Secondary | ICD-10-CM | POA: Diagnosis not present

## 2023-03-23 DIAGNOSIS — Z7689 Persons encountering health services in other specified circumstances: Secondary | ICD-10-CM

## 2023-03-23 DIAGNOSIS — R030 Elevated blood-pressure reading, without diagnosis of hypertension: Secondary | ICD-10-CM

## 2023-03-23 DIAGNOSIS — Z09 Encounter for follow-up examination after completed treatment for conditions other than malignant neoplasm: Secondary | ICD-10-CM

## 2023-03-23 DIAGNOSIS — Z8673 Personal history of transient ischemic attack (TIA), and cerebral infarction without residual deficits: Secondary | ICD-10-CM | POA: Diagnosis not present

## 2023-03-23 LAB — PROTHROMBIN GENE MUTATION

## 2023-03-23 LAB — FACTOR 5 LEIDEN

## 2023-03-23 MED ORDER — CLOPIDOGREL BISULFATE 75 MG PO TABS: 75.0000 mg | ORAL_TABLET | Freq: Every day | ORAL | 3 refills | Status: DC

## 2023-03-23 MED ORDER — ROSUVASTATIN CALCIUM 20 MG PO TABS: 20.0000 mg | ORAL_TABLET | Freq: Every day | ORAL | 3 refills | Status: DC

## 2023-03-23 NOTE — Progress Notes (Unsigned)
New Patient Office Visit  Subjective:   Curtis Grimes [birth year] 03/23/2023  Chief Complaint  Patient presents with   Establish Care    Pt. Here to establish care. Pt was seen at [facility] on 03-18-23 due to a mini stroke.    Headache    Pt. Stats he has a headache.    HPI: Curtis Grimes presents today to establish care at [facility] at [facility]. Introduced to Publishing rights manager role and practice setting.  All questions answered.   Last PCP: *** Last annual physical: *** Concerns: See below    Acute Stroke Follow up :  PT cleared   Headache: States headache is intermittent and will fluctuate. Patient states the headache is dull.  He is taking Tylenol as needed and pain will usually relieve. He is taking Tylenol 500mg  twice daily.  Denies nausea, vomiting, vision changes. No syncope, dizziness.   Has appt with  Family hx of HTN.    The following portions of the patient's history were reviewed and updated as appropriate: past medical history, past surgical history, family history, social history, allergies, medications, and problem list.   Patient Active Problem List   Diagnosis Date Noted   Acute ischemic stroke (HCC) 03/20/2023   Hypokalemia 03/18/2023   Vertigo 03/18/2023   Eosinophilic esophagitis 01/07/2018   Other allergic rhinitis 01/07/2018   Past Medical History:  Diagnosis Date   Allergy    Phreesia 02/11/2020   Depression    Dysphagia    Eosinophilic esophagitis    Meningitis    Past Surgical History:  Procedure Laterality Date   CAUTERIZE INNER NOSE     childhood   VASECTOMY     Family History  Problem Relation Age of Onset   Diabetes Mother    Heart disease Mother    Hypertension Mother    Diabetes Maternal Grandmother    Heart disease Maternal Grandmother    Stroke Maternal Grandmother    Hypertension Paternal Grandmother    Colon cancer Neg Hx    Esophageal cancer Neg Hx    Rectal  cancer Neg Hx    Stomach cancer Neg Hx    Social History   Socioeconomic History   Marital status: Divorced    Spouse name: Not on file   Number of children: 2   Years of education: Not on file   Highest education level: Bachelor's degree (e.g., BA, AB, BS)  Occupational History   Not on file  Tobacco Use   Smoking status: Never   Smokeless tobacco: Never  Vaping Use   Vaping status: Never Used  Substance and Sexual Activity   Alcohol use: Yes    Comment: occ   Drug use: Never   Sexual activity: Yes  Other Topics Concern   Not on file  Social History Narrative   Not on file   Social Drivers of Health   Financial Resource Strain: Low Risk  (03/23/2023)   Overall Financial Resource Strain (CARDIA)    Difficulty of Paying Living Expenses: Not hard at all  Food Insecurity: No Food Insecurity (03/23/2023)   Hunger Vital Sign    Worried About Running Out of Food in the Last Year: Never true    Ran Out of Food in the Last Year: Never true  Transportation Needs: No Transportation Needs (03/23/2023)   PRAPARE - Administrator, Civil Service (Medical): No    Lack of Transportation (Non-Medical): No  Physical  Activity: Insufficiently Active (03/23/2023)   Exercise Vital Sign    Days of Exercise per Week: 3 days    Minutes of Exercise per Session: 40 min  Stress: No Stress Concern Present (03/23/2023)   [employer] of Occupational Health - Occupational Stress Questionnaire    Feeling of Stress : Only a little  Social Connections: Socially Isolated (03/23/2023)   Social Connection and Isolation Panel [NHANES]    Frequency of Communication with Friends and Family: More than three times a week    Frequency of Social Gatherings with Friends and Family: Once a week    Attends Religious Services: Never    Database administrator or Organizations: No    Attends Engineer, structural: Not on file    Marital Status: Divorced  Intimate Partner Violence: Not At Risk  (03/19/2023)   Humiliation, Afraid, Rape, and Kick questionnaire    Fear of Current or Ex-Partner: No    Emotionally Abused: No    Physically Abused: No    Sexually Abused: No   Outpatient Medications Prior to Visit  Medication Sig Dispense Refill   aspirin EC 81 MG tablet Take 1 tablet (81 mg total) by mouth daily. Swallow whole. 30 tablet 12   clopidogrel (PLAVIX) 75 MG tablet Take 1 tablet (75 mg total) by mouth daily. 90 tablet 0   rosuvastatin (CRESTOR) 20 MG tablet Take 1 tablet (20 mg total) by mouth daily. 30 tablet 0   No facility-administered medications prior to visit.   Allergies  Allergen Reactions   Other Swelling    [place]    Shellfish Allergy Swelling   Beef-Derived Drug Products    Thick-It Chicken [employer] [place] [Nutritional Supplements]     Chicken     ROS: A complete ROS was performed with pertinent positives/negatives noted in the HPI. The remainder of the ROS are negative.   Objective:   Today's Vitals   03/23/23 1111  BP: (!) 133/100  Pulse: 83  SpO2: 99%  Weight: 174 lb 6.4 oz (79.1 kg)  Height: 5\' 11"  (1.803 m)    GENERAL: Well-appearing, in NAD. Well nourished.  SKIN: Pink, warm and dry. No rash, lesion, ulceration, or ecchymoses.  Head: Normocephalic. NECK: Trachea midline. Full ROM w/o pain or tenderness. No lymphadenopathy.  EARS: Tympanic membranes are intact, translucent without bulging and without drainage. Appropriate landmarks visualized.  EYES: Conjunctiva clear without exudates. EOMI, PERRL, no drainage present.  NOSE: Septum midline w/o deformity. Nares patent, mucosa pink and non-inflamed w/o drainage. No sinus tenderness.  THROAT: Uvula midline. Oropharynx clear. Tonsils non-inflamed without exudate. Mucous membranes pink and moist.  RESPIRATORY: Chest wall symmetrical. Respirations even and non-labored. Breath sounds clear to auscultation bilaterally.  CARDIAC: S1, S2 present, regular rate and rhythm without murmur or gallops.  Peripheral pulses 2+ bilaterally.  MSK: Muscle tone and strength appropriate for age. Joints w/o tenderness, redness, or swelling.  EXTREMITIES: Without clubbing, cyanosis, or edema.  NEUROLOGIC: No motor or sensory deficits. Steady, even gait. C2-C12 intact.  PSYCH/MENTAL STATUS: Alert, oriented x 3. Cooperative, appropriate mood and affect.    Health Maintenance Due  Topic Date Due   Hepatitis C Screening  Never done   DTaP/Tdap/Td (1 - Tdap) Never done    No results found for any visits on 03/23/23.     Assessment & Plan:  There are no diagnoses linked to this encounter.    Patient to reach out to office if new, worrisome, or unresolved symptoms arise or if  no improvement in patient's condition. Patient verbalized understanding and is agreeable to treatment plan. All questions answered to patient's satisfaction.    No follow-ups on file.    Hilbert Bible, [place]

## 2023-03-24 LAB — BASIC METABOLIC PANEL
BUN/Creatinine Ratio: 10 (ref 9–20)
BUN: 9 mg/dL (ref 6–20)
CO2: 22 mmol/L (ref 20–29)
Calcium: 10 mg/dL (ref 8.7–10.2)
Chloride: 99 mmol/L (ref 96–106)
Creatinine, Ser: 0.92 mg/dL (ref 0.76–1.27)
Glucose: 92 mg/dL (ref 70–99)
Potassium: 4.4 mmol/L (ref 3.5–5.2)
Sodium: 140 mmol/L (ref 134–144)
eGFR: 110 mL/min/{1.73_m2} (ref >59)

## 2023-03-24 LAB — CBC WITH DIFFERENTIAL/PLATELET
Basophils Absolute: 0 10*3/uL (ref 0.0–0.2)
Basos: 1 %
EOS (ABSOLUTE): 0.3 10*3/uL (ref 0.0–0.4)
Eos: 4 %
Hematocrit: 48.1 % (ref 37.5–51.0)
Hemoglobin: 16.3 g/dL (ref 13.0–17.7)
Immature Grans (Abs): 0 10*3/uL (ref 0.0–0.1)
Immature Granulocytes: 0 %
Lymphocytes Absolute: 2.4 10*3/uL (ref 0.7–3.1)
Lymphs: 33 %
MCH: 29.5 pg (ref 26.6–33.0)
MCHC: 33.9 g/dL (ref 31.5–35.7)
MCV: 87 fL (ref 79–97)
Monocytes Absolute: 0.7 10*3/uL (ref 0.1–0.9)
Monocytes: 9 %
Neutrophils Absolute: 3.8 10*3/uL (ref 1.4–7.0)
Neutrophils: 53 %
Platelets: 302 10*3/uL (ref 150–450)
RBC: 5.52 x10E6/uL (ref 4.14–5.80)
RDW: 12.8 % (ref 11.6–15.4)
WBC: 7.2 10*3/uL (ref 3.4–10.8)

## 2023-03-25 ENCOUNTER — Encounter (HOSPITAL_BASED_OUTPATIENT_CLINIC_OR_DEPARTMENT_OTHER): Payer: Self-pay | Admitting: Family Medicine

## 2023-03-25 NOTE — Progress Notes (Signed)
Hi Curtis Grimes,  Your WBC count is back to baseline and your hemoglobin and hemtocrit also improved. Your electrolytes, kidney and liver function is stable as well with starting new medications. I saw that you also are scheduled for the thyroid ultrasound on 03/26/23. I will let you know the results when I receive them as well.

## 2023-03-26 ENCOUNTER — Ambulatory Visit
Admission: RE | Admit: 2023-03-26 | Discharge: 2023-03-26 | Disposition: A | Discharge status: Home / Self Care | Payer: BC Managed Care – PPO | Source: Ambulatory Visit | Attending: Family Medicine | Admitting: Family Medicine

## 2023-03-26 ENCOUNTER — Ambulatory Visit (HOSPITAL_BASED_OUTPATIENT_CLINIC_OR_DEPARTMENT_OTHER): Payer: Self-pay | Admitting: Family Medicine

## 2023-03-26 DIAGNOSIS — E041 Nontoxic single thyroid nodule: Secondary | ICD-10-CM | POA: Insufficient documentation

## 2023-03-26 DIAGNOSIS — R030 Elevated blood-pressure reading, without diagnosis of hypertension: Secondary | ICD-10-CM | POA: Insufficient documentation

## 2023-03-26 DIAGNOSIS — Z8673 Personal history of transient ischemic attack (TIA), and cerebral infarction without residual deficits: Secondary | ICD-10-CM | POA: Insufficient documentation

## 2023-03-26 NOTE — Telephone Encounter (Signed)
Jon Gills, please see message sent by pt and advise.

## 2023-03-26 NOTE — Telephone Encounter (Signed)
   Chief Complaint: high bp Symptoms: headache, nausea, high bp readings Frequency: since 1/24 Pertinent Negatives: Patient denies blurred vision, vomiting, chest pain Disposition: [] ED /[] Urgent Care (no appt availability in office) / [x] Appointment(In office/virtual)/ []  [place] [facility]/ [] Home Care/ [] Refused Recommended Disposition /[] [place] [facility]/ []  Follow-up with PCP Additional Notes: Patient reports he has been experiencing high bp readings since his last visit on 1/24. Patient reports he has had a headache that won't go away and is now experiencing nausea. Pt reports his most recent bp reading was this morning at 135/95. Patient does not currently take BP medications. Per protocol, in office appt scheduled for next available 1/29. Pt advised to call back with worsening symptoms. Patient verbalized understanding.     Copied from CRM [id number]. Topic: Clinical - Medical Advice >> Mar 26, 2023  9:28 AM Antony Haste wrote: Reason for CRM: This patient states his blood pressure has been running high since his last office visit with his PCP on 01/24 and he is having continued headaches, he was informed blood pressure medication can be prescribed to help reduce his levels back to normal? He is wanting to discuss which meds he can take with his PCP if possible. Callback [phone] Reason for Disposition  [1] Systolic BP  >= 130 OR Diastolic >= 80 AND [2] not taking BP medications  Answer Assessment - Initial Assessment Questions 1. BLOOD PRESSURE: "What is the blood pressure?" "Did you take at least two measurements 5 minutes apart?"     This morning, 135/95 2. ONSET: "When did you take your blood pressure?"     Since 1/24 3. HOW: "How did you take your blood pressure?" (e.g., automatic home BP monitor, visiting nurse)     Automatic BP cuff 4. HISTORY: "Do you have a history of high blood pressure?"     No, family hx 5. MEDICINES: "Are you taking any medicines for blood  pressure?" "Have you missed any doses recently?"     none 6. OTHER SYMPTOMS: "Do you have any symptoms?" (e.g., blurred vision, chest pain, difficulty breathing, headache, weakness)     Headache, nausea  Protocols used: Blood Pressure - High-A-AH

## 2023-03-27 ENCOUNTER — Encounter (HOSPITAL_BASED_OUTPATIENT_CLINIC_OR_DEPARTMENT_OTHER): Payer: Self-pay | Admitting: Family Medicine

## 2023-03-27 NOTE — Progress Notes (Signed)
Hi Curtis Grimes,  Your ultrasound did show a benign appearing thyroid nodule on the left side of your thyroid. Given it's benign appearance, radiology recommends a repeat ultrasound in 1 year. If you are agreeable with this, we will send you a reminder in 1 year to schedule this and place the order. Please let me know if you have further questions.

## 2023-03-28 ENCOUNTER — Ambulatory Visit (INDEPENDENT_AMBULATORY_CARE_PROVIDER_SITE_OTHER): Payer: BC Managed Care – PPO | Admitting: Family Medicine

## 2023-03-28 DIAGNOSIS — R519 Headache, unspecified: Secondary | ICD-10-CM | POA: Diagnosis not present

## 2023-03-28 DIAGNOSIS — R030 Elevated blood-pressure reading, without diagnosis of hypertension: Secondary | ICD-10-CM | POA: Diagnosis not present

## 2023-03-28 MED ORDER — ATORVASTATIN CALCIUM 20 MG PO TABS: 20.0000 mg | ORAL_TABLET | Freq: Every day | ORAL | 3 refills | Status: DC

## 2023-03-28 NOTE — Progress Notes (Signed)
    Procedures performed today:    None.  Independent interpretation of notes and tests performed by another provider:   None.  Brief History, Exam, Impression, and Recommendations:    BP (!) 132/94   Pulse (!) 105   Temp 97.7 F (36.5 C) (Oral)   Ht 5\' 11"  (1.803 m)   Wt 170 lb 9.6 oz (77.4 kg)   SpO2 99%   BMI 23.79 kg/m   Acute nonintractable headache, unspecified headache type Assessment & Plan: Initially had headache prior to recent hospitalization.  With everything going on while in the hospital, he is uncertain about whether he continued to have headaches while there.  He has noticed on returning home that he has been having fairly constant headaches which will tend to be about 6-8 out of 10.  He has been utilizing Tylenol as needed to help with headache, benefits with this have been minimal.  Not aware of any specific triggers or exacerbating factors.  Denies any regular caffeine use, prior to hospitalization he was drinking maybe 2 cans of soda per day, is down to 1 can or less per day currently. Due to recent hospitalization, patient did have MRI of brain completed which showed acute ischemic stroke, otherwise was generally unremarkable.  This at least does provide some reassurance in regards to other possible etiologies of headache.  He has questions about whether Plavix or Crestor could be contributing to headaches.  Also wonders about blood pressure. Ultimately, I do feel that blood pressure is less likely to be causing headaches given borderline readings.  I feel that is probably less likely related to medications as well due to headache being noted prior to hospitalization.  However cannot make adjustment to statin therapy at this time and monitor progress with that.  Recommend continue with Plavix until appointment with neurology and can further discuss antiplatelet medication with them.  We can also check with neurology to see if they may be able to evaluate patient in the  office sooner given ongoing headaches  Orders: -     Ambulatory referral to Neurology  Elevated BP without diagnosis of hypertension Assessment & Plan: Has been checking blood pressure intermittently at home, did contact triage line due to reported elevated readings at home, at that time blood pressure was 135/95.  He has been having headaches as discussed separately.  No issues with chest pain.  He has been working on lifestyle modifications including reducing salt in his diet.  He reports that he does not eat meat.  He has questions about at what blood pressure he should have more concern and should go to the ER for. Long discussion today reviewing blood pressure management and recommendations.  Reviewed proper technique for checking blood pressure, discussed symptoms to be mindful of, blood pressure readings which would be of concern.  Again reviewed DASH diet with patient which he has been working towards.  Also discussed possible benefit with incorporating more potassium rich foods into diet.   Other orders -     Atorvastatin Calcium; Take 1 tablet (20 mg total) by mouth daily.  Dispense: 30 tablet; Refill: 3  Return if symptoms worsen or fail to improve.  Spent 34 minutes on this patient encounter, including preparation, chart review, face-to-face counseling with patient and coordination of care, and documentation of encounter   ___________________________________________ Ryleigh Buenger de [place], MD, ABFM, [facility]

## 2023-03-28 NOTE — Assessment & Plan Note (Signed)
Initially had headache prior to recent hospitalization.  With everything going on while in the hospital, he is uncertain about whether he continued to have headaches while there.  He has noticed on returning home that he has been having fairly constant headaches which will tend to be about 6-8 out of 10.  He has been utilizing Tylenol as needed to help with headache, benefits with this have been minimal.  Not aware of any specific triggers or exacerbating factors.  Denies any regular caffeine use, prior to hospitalization he was drinking maybe 2 cans of soda per day, is down to 1 can or less per day currently. Due to recent hospitalization, patient did have MRI of brain completed which showed acute ischemic stroke, otherwise was generally unremarkable.  This at least does provide some reassurance in regards to other possible etiologies of headache.  He has questions about whether Plavix or Crestor could be contributing to headaches.  Also wonders about blood pressure. Ultimately, I do feel that blood pressure is less likely to be causing headaches given borderline readings.  I feel that is probably less likely related to medications as well due to headache being noted prior to hospitalization.  However cannot make adjustment to statin therapy at this time and monitor progress with that.  Recommend continue with Plavix until appointment with neurology and can further discuss antiplatelet medication with them.  We can also check with neurology to see if they may be able to evaluate patient in the office sooner given ongoing headaches

## 2023-03-28 NOTE — Patient Instructions (Signed)

## 2023-03-28 NOTE — Assessment & Plan Note (Signed)
Has been checking blood pressure intermittently at home, did contact triage line due to reported elevated readings at home, at that time blood pressure was 135/95.  He has been having headaches as discussed separately.  No issues with chest pain.  He has been working on lifestyle modifications including reducing salt in his diet.  He reports that he does not eat meat.  He has questions about at what blood pressure he should have more concern and should go to the ER for. Long discussion today reviewing blood pressure management and recommendations.  Reviewed proper technique for checking blood pressure, discussed symptoms to be mindful of, blood pressure readings which would be of concern.  Again reviewed DASH diet with patient which he has been working towards.  Also discussed possible benefit with incorporating more potassium rich foods into diet.

## 2023-04-02 ENCOUNTER — Emergency Department (HOSPITAL_BASED_OUTPATIENT_CLINIC_OR_DEPARTMENT_OTHER)
Admission: EM | Admit: 2023-04-02 | Discharge: 2023-04-02 | Disposition: A | Discharge status: Home / Self Care | Payer: BC Managed Care – PPO | Attending: Emergency Medicine | Admitting: Emergency Medicine

## 2023-04-02 ENCOUNTER — Encounter (HOSPITAL_BASED_OUTPATIENT_CLINIC_OR_DEPARTMENT_OTHER): Payer: Self-pay | Admitting: *Deleted

## 2023-04-02 ENCOUNTER — Emergency Department (HOSPITAL_BASED_OUTPATIENT_CLINIC_OR_DEPARTMENT_OTHER): Payer: BC Managed Care – PPO

## 2023-04-02 ENCOUNTER — Ambulatory Visit (HOSPITAL_BASED_OUTPATIENT_CLINIC_OR_DEPARTMENT_OTHER): Payer: Self-pay | Admitting: Family Medicine

## 2023-04-02 ENCOUNTER — Other Ambulatory Visit: Payer: Self-pay

## 2023-04-02 DIAGNOSIS — Z7982 Long term (current) use of aspirin: Secondary | ICD-10-CM | POA: Diagnosis not present

## 2023-04-02 DIAGNOSIS — Z7901 Long term (current) use of anticoagulants: Secondary | ICD-10-CM | POA: Diagnosis not present

## 2023-04-02 DIAGNOSIS — G44209 Tension-type headache, unspecified, not intractable: Secondary | ICD-10-CM | POA: Insufficient documentation

## 2023-04-02 DIAGNOSIS — Z8673 Personal history of transient ischemic attack (TIA), and cerebral infarction without residual deficits: Secondary | ICD-10-CM | POA: Insufficient documentation

## 2023-04-02 DIAGNOSIS — R519 Headache, unspecified: Secondary | ICD-10-CM | POA: Diagnosis not present

## 2023-04-02 LAB — URINALYSIS, ROUTINE W REFLEX MICROSCOPIC
Bilirubin Urine: NEGATIVE
Glucose, UA: NEGATIVE mg/dL
Hgb urine dipstick: NEGATIVE
Ketones, ur: NEGATIVE mg/dL
Leukocytes,Ua: NEGATIVE
Nitrite: NEGATIVE
Protein, ur: NEGATIVE mg/dL
Specific Gravity, Urine: 1.005 (ref 1.005–1.030)
pH: 6 (ref 5.0–8.0)

## 2023-04-02 LAB — COMPREHENSIVE METABOLIC PANEL
ALT: 27 U/L (ref 0–44)
AST: 26 U/L (ref 15–41)
Albumin: 4.7 g/dL (ref 3.5–5.0)
Alkaline Phosphatase: 63 U/L (ref 38–126)
Anion gap: 11 (ref 5–15)
BUN: 6 mg/dL (ref 6–20)
CO2: 24 mmol/L (ref 22–32)
Calcium: 9.9 mg/dL (ref 8.9–10.3)
Chloride: 103 mmol/L (ref 98–111)
Creatinine, Ser: 0.8 mg/dL (ref 0.61–1.24)
GFR, Estimated: >60 mL/min (ref >60)
Glucose, Bld: 97 mg/dL (ref 70–99)
Potassium: 4.1 mmol/L (ref 3.5–5.1)
Sodium: 138 mmol/L (ref 135–145)
Total Bilirubin: 0.8 mg/dL (ref 0.0–1.2)
Total Protein: 8 g/dL (ref 6.5–8.1)

## 2023-04-02 LAB — CBC WITH DIFFERENTIAL/PLATELET
Abs Immature Granulocytes: 0.01 10*3/uL (ref 0.00–0.07)
Basophils Absolute: 0.1 10*3/uL (ref 0.0–0.1)
Basophils Relative: 1 %
Eosinophils Absolute: 0.2 10*3/uL (ref 0.0–0.5)
Eosinophils Relative: 3 %
HCT: 44.2 % (ref 39.0–52.0)
Hemoglobin: 15 g/dL (ref 13.0–17.0)
Immature Granulocytes: 0 %
Lymphocytes Relative: 26 %
Lymphs Abs: 1.9 10*3/uL (ref 0.7–4.0)
MCH: 29.1 pg (ref 26.0–34.0)
MCHC: 33.9 g/dL (ref 30.0–36.0)
MCV: 85.8 fL (ref 80.0–100.0)
Monocytes Absolute: 0.5 10*3/uL (ref 0.1–1.0)
Monocytes Relative: 7 %
Neutro Abs: 4.7 10*3/uL (ref 1.7–7.7)
Neutrophils Relative %: 63 %
Platelets: 342 10*3/uL (ref 150–400)
RBC: 5.15 MIL/uL (ref 4.22–5.81)
RDW: 12.6 % (ref 11.5–15.5)
WBC: 7.3 10*3/uL (ref 4.0–10.5)
nRBC: 0 % (ref 0.0–0.2)

## 2023-04-02 NOTE — ED Notes (Signed)
Pt is being seen by PA during triage.  PA will enter orders

## 2023-04-02 NOTE — Telephone Encounter (Signed)
Chief Complaint: Neck pain/stiffness, headache Symptoms: Neck pain, headache Frequency: since Friday Pertinent Negatives: Patient denies known fever, weakness in arms and legs, chest pain Disposition: [x] ED /[] Urgent Care (no appt availability in office) / [] Appointment(In office/virtual)/ []  [place] [facility]/ [] Home Care/ [] Refused Recommended Disposition /[] [place] [facility]/ []  Follow-up with PCP Additional Notes: Patient called in stating he is having new onset of neck pain with severe headache since Friday. Patient was seen in office last week but did not have neck pain at that time. Patient states he does have a history of meningitis back around 2007. Patient states he has mild stiffness when trying to touch chin to chest, along with neck pain and headache that are not resolved by Tylenol. Patient denies fever, chest pain, difficulty breathing, swelling of the neck, rash. Patient states he feels his right lymph node may be mildly swollen. Advised patient to be seen and evaluated at ED asap. Patient states he will be going to [facility] for evaluation.    Reason for Disposition  [1] Stiff neck (can't put chin to chest) AND [2] headache  Answer Assessment - Initial Assessment Questions 1. ONSET: "When did the pain begin?"      Friday  2. LOCATION: "Where does it hurt?"     Hurts most when looking down, middle of neck and up towards base of head 3. PATTERN "Does the pain come and go, or has it been constant since it started?"      Constant  4. SEVERITY: "How bad is the pain?"  (Scale 1-10; or mild, moderate, severe)   - NO PAIN (0): no pain or only slight stiffness    - MILD (1-3): doesn't interfere with normal activities    - MODERATE (4-7): interferes with normal activities or awakens from sleep    - SEVERE (8-10):  excruciating pain, unable to do any normal activities      4 5. RADIATION: "Does the pain go anywhere else, shoot into your arms?"     Radiates up into  head 6. CORD SYMPTOMS: "Any weakness or numbness of the arms or legs?"     No 7. CAUSE: "What do you think is causing the neck pain?"     Unsure 8. NECK OVERUSE: "Any recent activities that involved turning or twisting the neck?"     No 9. OTHER SYMPTOMS: "Do you have any other symptoms?" (e.g., headache, fever, chest pain, difficulty breathing, neck swelling)     headache  Protocols used: Neck Pain or Stiffness-A-AH

## 2023-04-02 NOTE — Discharge Instructions (Addendum)
Your labs are reassuring today.  Your blood counts are normal and have no signs of infection.  Your electrolytes, kidney, and liver function labs are normal.  Your urine does not show any signs of infection.  The CT of your head is without any abnormalities to explain your headaches.  You may take up to 1000mg  of tylenol every 6 hours as needed for pain.  Do not take more then 4g per day.  Please do not take tylenol for more than 3 days a week as this can cause rebound headaches if these medications are taken more frequently.  Please follow-up with your neurologist within the next 2 weeks to discuss further management of your migraines.  Your blood pressure is elevated today, please follow-up with your PCP within the next month to discuss further blood pressure management  Please return to the ER if you develop any severe worsening of your headache, fevers, inability to move your neck, new rash, any other new or concerning symptoms.

## 2023-04-02 NOTE — ED Triage Notes (Signed)
Pt states that he had a stroke a few weeks ago and he has continued to have headaches since then.  [facility] was seen [facility] for [facility].  Pt called his PCP today and was told to come for evaluation due to neck and head pain since Friday.  Pt was told to come here to rule out meningitis due to hx of meningitis in 2007.  Pt states that he does not have any deficits from his stroke and denies any focal weakness.  Neck supple in triage, pain in back of neck along spine, left greater than right.  No fevers or issues with his vision.

## 2023-04-02 NOTE — ED Provider Notes (Signed)
[place] EMERGENCY DEPARTMENT AT [facility] Provider Note   CSN: [id number] Arrival date & time: 04/02/23  1341     History  Chief Complaint  Patient presents with   Headache    Curtis Grimes is a 38 y.o. male with history of stroke, meningitis, presents with concern for headaches ongoing since his hospital discharge on 03/20/2023.  States that headaches come and go, but he is not able to completely get rid of them. Reports that the headache sometimes goes down into his neck.  Denies any fevers, chills, rash, cough, sore throat, any other sick symptoms. Denies difficulty moving his neck. Denies any photosensitivity or sensitivity to sounds.  Denies any nausea or vomiting.  Denies any changes in vision.  Has been taking Tylenol at home for his headache.   Headache      Home Medications Prior to Admission medications   Medication Sig Start Date End Date Taking? Authorizing Provider  aspirin EC 81 MG tablet Take 1 tablet (81 mg total) by mouth daily. Swallow whole. 03/21/23   Hughie Closs, MD  atorvastatin (LIPITOR) 20 MG tablet Take 1 tablet (20 mg total) by mouth daily. 03/28/23   de [place], Raymond J, MD  clopidogrel (PLAVIX) 75 MG tablet Take 1 tablet (75 mg total) by mouth daily. 03/23/23   Caudle, Shelton Silvas, FNP      Allergies    Other, Shellfish allergy, Beef-derived drug products, and Thick-it chicken a la king [nutritional supplements]    Review of Systems   Review of Systems  Neurological:  Positive for headaches.    Physical Exam Updated Vital Signs BP (!) 144/102   Pulse 91   Temp 97.9 F (36.6 C)   Resp 20   SpO2 100%  Physical Exam Vitals and nursing note reviewed.  Constitutional:      General: He is not in acute distress.    Appearance: He is well-developed.     Comments: Well-appearing, talking at normal volume.  No nausea or vomiting. Lights on in the room  HENT:     Head: Normocephalic and atraumatic.  Eyes:     Extraocular Movements:  Extraocular movements intact.     Conjunctiva/sclera: Conjunctivae normal.     Pupils: Pupils are equal, round, and reactive to light.  Neck:     Comments: Able to flex and extend neck without difficulty, no nuchal rigidity Cardiovascular:     Rate and Rhythm: Normal rate and regular rhythm.     Heart sounds: No murmur heard. Pulmonary:     Effort: Pulmonary effort is normal. No respiratory distress.     Breath sounds: Normal breath sounds.  Abdominal:     Palpations: Abdomen is soft.     Tenderness: There is no abdominal tenderness.  Musculoskeletal:        General: No swelling.     Cervical back: Neck supple.     Comments: Moves all extremities without difficulty  Skin:    General: Skin is warm and dry.     Capillary Refill: Capillary refill takes less than 2 seconds.  Neurological:     Mental Status: He is alert.     Comments: Cranial nerves III through XII intact  Psychiatric:        Mood and Affect: Mood normal.     ED Results / Procedures / Treatments   Labs (all labs ordered are listed, but only abnormal results are displayed) Labs Reviewed  URINALYSIS, ROUTINE W REFLEX MICROSCOPIC - Abnormal; Notable for  the following components:      Result Value   Color, Urine COLORLESS (*)    All other components within normal limits  CBC WITH DIFFERENTIAL/PLATELET  COMPREHENSIVE METABOLIC PANEL  CBG MONITORING, ED    EKG None  Radiology CT Head Wo Contrast Result Date: 04/02/2023 CLINICAL DATA:  Headache, increasing frequency or severity EXAM: CT HEAD WITHOUT CONTRAST TECHNIQUE: Contiguous axial images were obtained from the base of the skull through the vertex without intravenous contrast. RADIATION DOSE REDUCTION: This exam was performed according to the departmental dose-optimization program which includes automated exposure control, adjustment of the mA and/or kV according to patient size and/or use of iterative reconstruction technique. COMPARISON:  None Available.  FINDINGS: Brain: No evidence of acute infarction, hemorrhage, hydrocephalus, extra-axial collection or mass lesion/mass effect. Vascular: No hyperdense vessel. Skull: No acute fracture. Sinuses/Orbits: Clear sinuses.  No acute orbital findings. Other: No mastoid effusions. IMPRESSION: No evidence of acute intracranial abnormality. Electronically Signed   By: Feliberto Harts M.D.   On: 04/02/2023 17:21    Procedures Procedures    Medications Ordered in ED Medications - No data to display  ED Course/ Medical Decision Making/ A&P                                 Medical Decision Making    Differential diagnosis includes but is not limited to headache, migraine, intra-cranial abnormality, meningitis  ED Course:  Patient well-appearing, stable vital signs aside from a slightly elevated blood pressure upon arrival at 130/98.  He reports headache has been on and off and has not responded to at home with Tylenol.  CT head here without any acute abnormalities.  He does report that the headache sometimes goes down to the back of his head and neck, but no nuchal rigidity on exam.  No other sick symptoms, no leukocytosis, no concern for meningitis at this time.  Suspect tension headache versus migraine.  I offered patient a migraine cocktail, he declines saying his headache is not bad enough right now to any medication.  I discussed that he needs to follow-up with his neurologist for further management of his migraines.  He is in agreement with this plan and verbalizes understanding. I Ordered, and personally interpreted labs.  The pertinent results include:  CBC unremarkable, no leukocytosis CMP unremarkable Urinalysis without any signs of infection   Patient stable and appropriate for discharge home at this time  Impression: Tension headache versus migraine  Disposition:  The patient was discharged home with instructions to take Tylenol and ibuprofen as needed for Return precautions  given.  Imaging Studies ordered: I ordered imaging studies including CT head I independently visualized the imaging with scope of interpretation limited to determining acute life threatening conditions related to emergency care. Imaging showed no acute abnormalities I agree with the radiologist interpretation  External records from outside source obtained and reviewed including discharge summary from 03/20/2023 where he was seen for stroke              Final Clinical Impression(s) / ED Diagnoses Final diagnoses:  Tension headache    Rx / DC Orders ED Discharge Orders     None         Arabella Merles, Cordelia Poche 04/02/23 2034    Melene Plan, DO 04/02/23 2121

## 2023-04-02 NOTE — ED Provider Triage Note (Signed)
Emergency Medicine Provider Triage Evaluation Note  Curtis Grimes , a 38 y.o. male  was evaluated in triage.  Pt complains of HA since being d/c from hospital for cva. Endorses it waxes and wanes in intensity but consistently is at least a 4/10. At worst, it can be 8/10. Describes HA around back of head and goes into left side of neck. Currently on plavix  Hx of ischemic cva, meningitis (2007)  Review of Systems  Positive: HA Negative: Visual disturbances, dizziness  Physical Exam  BP (!) 130/98 (BP Location: Left Arm)   Pulse 99   Temp 98.3 F (36.8 C) (Oral)   Resp 18   SpO2 100%  Gen:   Awake, no distress. A&Ox3 Resp:  Normal effort  MSK:   Moves extremities without difficulty  Other:  Neuro intact. No weakness. Walks without difficulty  Medical Decision Making  Medically screening exam initiated at 3:39 PM.  Appropriate orders placed.  [name] was informed that the remainder of the evaluation will be completed by another provider, this initial triage assessment does not replace that evaluation, and the importance of remaining in the ED until their evaluation is complete.  Workup started   Curtis Grimes, [place] 04/02/23 1544

## 2023-04-09 ENCOUNTER — Ambulatory Visit (INDEPENDENT_AMBULATORY_CARE_PROVIDER_SITE_OTHER): Payer: BC Managed Care – PPO | Admitting: Family Medicine

## 2023-04-09 ENCOUNTER — Encounter (HOSPITAL_BASED_OUTPATIENT_CLINIC_OR_DEPARTMENT_OTHER): Payer: Self-pay | Admitting: Family Medicine

## 2023-04-09 VITALS — BP 136/80 | HR 96 | Ht 71.0 in | Wt 170.0 lb

## 2023-04-09 DIAGNOSIS — R030 Elevated blood-pressure reading, without diagnosis of hypertension: Secondary | ICD-10-CM | POA: Diagnosis not present

## 2023-04-09 DIAGNOSIS — Z8673 Personal history of transient ischemic attack (TIA), and cerebral infarction without residual deficits: Secondary | ICD-10-CM | POA: Diagnosis not present

## 2023-04-09 DIAGNOSIS — E041 Nontoxic single thyroid nodule: Secondary | ICD-10-CM | POA: Diagnosis not present

## 2023-04-09 NOTE — Progress Notes (Signed)
 Subjective:   Curtis Grimes [birth year] 04/09/2023  Chief Complaint  Patient presents with   Medical Management of Chronic Issues    2-week follow up for BP; denies any concerns since last OV.    HPI: Curtis Grimes presents today for re-assessment and management of chronic medical conditions.   Curtis Grimes is a 38 year old male with a history of vertigo, acute ischemic stroke, thyroid  nodule, meningitis presenting for follow-up of elevated BP reading in office without diagnosis of hypertension.  Patient has been monitoring his blood pressure and salt intake at home and has brought readings from home.  He was previously seen in the office approximately 1 month ago and reported chronic daily headaches post CVA.  He is following up with neurology in March and is on Plavix , atorvastatin , aspirin  for prevention of CVA.  He reports that headaches have improved significantly from prior visit and states headaches are every 1 to 2 days or every other day.  He is using Tylenol  with relief as needed.  States headaches are now just a dull, annoyance and not severe.   Exercising Regularly: Yes, he is back to exercise post CVA Denies dizziness, CP, SHOB, vision changes.     He reports headache every other day or now every 2 days. He uses Tylenol  as needed. He reports improvement in the past month in headache severity and frequency.   BP Readings from Last 3 Encounters:  04/09/23 136/80  04/02/23 (!) 144/102  03/28/23 (!) 132/94   BP Home Readings for the past month:  128/83 124/86 125/91 122/86 127/88 132/89 126/86 127/87 122/81 117/81 120/79   The following portions of the patient's history were reviewed and updated as appropriate: past medical history, past surgical history, family history, social history, allergies, medications, and problem list.   Patient Active Problem List   Diagnosis Date Noted   Headache 03/28/2023   Thyroid  nodule 03/26/2023   Status post CVA  03/26/2023   Elevated BP without diagnosis of hypertension 03/26/2023   Acute ischemic stroke (HCC) 03/20/2023   Hypokalemia 03/18/2023   Vertigo 03/18/2023   Eosinophilic esophagitis 01/07/2018   Other allergic rhinitis 01/07/2018   Past Medical History:  Diagnosis Date   Allergy     Phreesia 02/11/2020   Depression    Dysphagia    Eosinophilic esophagitis    Meningitis    Past Surgical History:  Procedure Laterality Date   CAUTERIZE INNER NOSE     childhood   VASECTOMY     Family History  Problem Relation Age of Onset   Diabetes Mother    Heart disease Mother    Hypertension Mother    Diabetes Maternal Grandmother    Heart disease Maternal Grandmother    Stroke Maternal Grandmother    Hypertension Paternal Grandmother    Colon cancer Neg Hx    Esophageal cancer Neg Hx    Rectal cancer Neg Hx    Stomach cancer Neg Hx    Outpatient Medications Prior to Visit  Medication Sig Dispense Refill   aspirin  EC 81 MG tablet Take 1 tablet (81 mg total) by mouth daily. Swallow whole. 30 tablet 12   atorvastatin  (LIPITOR) 20 MG tablet Take 1 tablet (20 mg total) by mouth daily. 30 tablet 3   clopidogrel  (PLAVIX ) 75 MG tablet Take 1 tablet (75 mg total) by mouth daily. 90 tablet 3   No facility-administered medications prior to visit.   Allergies  Allergen Reactions   Other Swelling  [place]    Shellfish Allergy  Swelling   Beef-Derived Drug Products    Thick-It Chicken A La King [Nutritional Supplements]     Chicken      ROS: A complete ROS was performed with pertinent positives/negatives noted in the HPI. The remainder of the ROS are negative.    Objective:   Today's Vitals   04/09/23 1003 04/09/23 1020  BP: (!) 142/96 136/80  Pulse: 96   SpO2: 100%   Weight: 170 lb (77.1 kg)   Height: 5\' 11"  (1.803 m)     Physical Exam          GENERAL: Well-appearing, in NAD. Well nourished.  SKIN: Pink, warm and dry.  Head: Normocephalic. NECK: Trachea midline. Full  ROM w/o pain or tenderness.  RESPIRATORY: Chest wall symmetrical. Respirations even and non-labored. Breath sounds clear to auscultation bilaterally.  CARDIAC: S1, S2 present, regular rate and rhythm without murmur or gallops. Peripheral pulses 2+ bilaterally.Carotid arteries without bruit or thrill.   MSK: Muscle tone and strength appropriate for age.  EXTREMITIES: Without clubbing, cyanosis, or edema.  NEUROLOGIC: No motor or sensory deficits. Steady, even gait. C2-C12 intact.  PSYCH/MENTAL STATUS: Alert, oriented x 3. Cooperative, appropriate mood and affect.     Assessment & Plan:  1. Elevated BP without diagnosis of hypertension (Primary) Well-controlled at home with patient's BP readings.  Blood pressure improved with manual recheck by PCP after rest in office.  No concern for hypertension at this time.  Patient will continue to monitor salt intake, blood pressure readings, and increase exercise tolerance as able.  2. Status post CVA Patient currently compliant with medication regimen and plans to follow-up with neurology in March as scheduled.  3. Thyroid  nodule Incidental finding during hospital admission.  Will repeat thyroid  ultrasound in approximately 1 year and will send reminder to patient to complete this.   Return in about 4 months (around 08/07/2023) for ANNUAL PHYSICAL, Follow up HLD (fasting labs at visit) .    Patient to reach out to office if new, worrisome, or unresolved symptoms arise or if no improvement in patient's condition. Patient verbalized understanding and is agreeable to treatment plan. All questions answered to patient's satisfaction.    Nonda Bays, [place]

## 2023-05-02 ENCOUNTER — Encounter: Payer: Self-pay | Admitting: Neurology

## 2023-05-02 ENCOUNTER — Ambulatory Visit (INDEPENDENT_AMBULATORY_CARE_PROVIDER_SITE_OTHER): Payer: BC Managed Care – PPO | Admitting: Neurology

## 2023-05-02 VITALS — BP 124/81 | HR 90 | Ht 71.0 in | Wt 168.8 lb

## 2023-05-02 DIAGNOSIS — I6389 Other cerebral infarction: Secondary | ICD-10-CM | POA: Diagnosis not present

## 2023-05-02 DIAGNOSIS — I639 Cerebral infarction, unspecified: Secondary | ICD-10-CM

## 2023-05-02 DIAGNOSIS — E7849 Other hyperlipidemia: Secondary | ICD-10-CM | POA: Diagnosis not present

## 2023-05-02 DIAGNOSIS — I6502 Occlusion and stenosis of left vertebral artery: Secondary | ICD-10-CM | POA: Diagnosis not present

## 2023-05-02 NOTE — Progress Notes (Unsigned)
 [facility] [address] [place]. [place] [address] [phone]       OFFICE CONSULT NOTE  Curtis Grimes Date of Birth:  [birth year] Medical Record Number:  [id number]   Referring MD: Wendi Maya, PA-C  Reason for Referral: Stroke  HPI: [name] is a pleasant 38 year old Caucasian male seen today for initial office consultation visit for stroke.  History is obtained from the patient and review of electronic medical records and I personally reviewed pertinent available imaging films in PACS.  He has past medical history of depression, allergies, dysphagia, eosinophilic esophagitis and meningitis and remote vertigo due to inner ear infection.  He presented to the emergency room on 03/18/2023 for evaluation for sudden onset of pounding left-sided headache, left-sided tingling and numbness, incoordination of gait, poor balance vertigo nausea vomiting and double vision.  Once in the ED he began to experience vertigo.  Initial MRI scan of the brain was normal with the one repeated the next day confirmed a small lateral medullary diffusion hyperintensity felt to be an acute infarct.  CT angiogram showed patent dominant right vertebral artery with hypoplastic left vertebral artery with area of possible narrowing in the C3-C4 junction felt to be a clot or possible small focal dissection.  LDL cholesterol 140 mg percent hemoglobin A1c was 5.4.  Urine drug screen was negative.  Echocardiogram showed ejection fraction of 6065%.  Hypercoagulable panel labs were all negative.  Patient denied any history of neck injury, neck manipulation, recent motor vehicle accident or lifting heavy weights.  He had no significant vascular risk factors and no prior history of stroke TIA.  There is no family history of stroke in young.  Patient started on dual antiplatelet therapy aspirin and Plavix and discharged with outpatient therapies.  He states that he has done well.  All his symptoms are resolved.   He has had no recurrent symptoms or any lasting deficits.  He is tolerating aspirin and Plavix well with minor bruising and no bleeding.  Is tolerating Lipitor well without muscle aches and pains.  He has no new complaints today. ROS:   14 system review of systems is positive for headache, numbness, vertigo, dizziness, imbalance, nausea, vomiting, diplopia all other systems negative  PMH:  Past Medical History:  Diagnosis Date   Allergy    Phreesia 02/11/2020   Depression    Dysphagia    Eosinophilic esophagitis    Meningitis     Social History:  Social History   Socioeconomic History   Marital status: Divorced    Spouse name: Not on file   Number of children: 2   Years of education: Not on file   Highest education level: Bachelor's degree (e.g., BA, AB, BS)  Occupational History   Not on file  Tobacco Use   Smoking status: Never   Smokeless tobacco: Never  Vaping Use   Vaping status: Never Used  Substance and Sexual Activity   Alcohol use: Yes    Comment: occ   Drug use: Never   Sexual activity: Yes  Other Topics Concern   Not on file  Social History Narrative   Pt lives alone    Pt works    Social Drivers of Corporate investment banker Strain: Low Risk  (03/23/2023)   Overall Financial Resource Strain (CARDIA)    Difficulty of Paying Living Expenses: Not hard at all  Food Insecurity: No Food Insecurity (03/23/2023)   Hunger Vital Sign    Worried About Running Out  of Food in the Last Year: Never true    Ran Out of Food in the Last Year: Never true  Transportation Needs: No Transportation Needs (03/23/2023)   PRAPARE - Administrator, Civil Service (Medical): No    Lack of Transportation (Non-Medical): No  Physical Activity: Insufficiently Active (03/23/2023)   Exercise Vital Sign    Days of Exercise per Week: 3 days    Minutes of Exercise per Session: 40 min  Stress: No Stress Concern Present (03/23/2023)   [employer] of Occupational Health -  Occupational Stress Questionnaire    Feeling of Stress : Only a little  Social Connections: Socially Isolated (03/23/2023)   Social Connection and Isolation Panel [NHANES]    Frequency of Communication with Friends and Family: More than three times a week    Frequency of Social Gatherings with Friends and Family: Once a week    Attends Religious Services: Never    Database administrator or Organizations: No    Attends Engineer, structural: Not on file    Marital Status: Divorced  Intimate Partner Violence: Not At Risk (03/19/2023)   Humiliation, Afraid, Rape, and Kick questionnaire    Fear of Current or Ex-Partner: No    Emotionally Abused: No    Physically Abused: No    Sexually Abused: No    Medications:   Current Outpatient Medications on File Prior to Visit  Medication Sig Dispense Refill   aspirin EC 81 MG tablet Take 1 tablet (81 mg total) by mouth daily. Swallow whole. 30 tablet 12   atorvastatin (LIPITOR) 20 MG tablet Take 1 tablet (20 mg total) by mouth daily. 30 tablet 3   clopidogrel (PLAVIX) 75 MG tablet Take 1 tablet (75 mg total) by mouth daily. 90 tablet 3   No current facility-administered medications on file prior to visit.    Allergies:   Allergies  Allergen Reactions   Other Swelling    [place]    Shellfish Allergy Swelling   Beef-Derived Drug Products    Thick-It Chicken [place] [Nutritional Supplements]     Chicken     Physical Exam General: well developed, well nourished, seated, in no evident distress Head: head normocephalic and atraumatic.   Neck: supple with no carotid or supraclavicular bruits Cardiovascular: regular rate and rhythm, no murmurs Musculoskeletal: no deformity Skin:  no rash/petichiae Vascular:  Normal pulses all extremities  Neurologic Exam Mental Status: Awake and fully alert. Oriented to place and time. Recent and remote memory intact. Attention span, concentration and fund of knowledge appropriate. Mood and affect  appropriate.  Cranial Nerves: Fundoscopic exam reveals sharp disc margins. Pupils equal, briskly reactive to light. Extraocular movements full without nystagmus.  Minimal drooping of the left eyelid.  Visual fields full to confrontation. Hearing intact. Facial sensation intact. Face, tongue, palate moves normally and symmetrically.  Motor: Normal bulk and tone. Normal strength in all tested extremity muscles. Sensory.: intact to touch , pinprick , position and vibratory sensation.  Coordination: Rapid alternating movements normal in all extremities. Finger-to-nose and heel-to-shin performed accurately bilaterally. Gait and Station: Arises from chair without difficulty. Stance is normal. Gait demonstrates normal stride length and balance . Able to heel, toe and tandem walk without difficulty.  Reflexes: 1+ and symmetric. Toes downgoing.   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  No significant vascular risk factors except hyperlipidemia.     PLAN:I had a long d/w patient about his recent medullary stroke, vertebral artery occlusion/dissection,risk for recurrent stroke/TIAs, personally independently reviewed imaging studies and stroke evaluation results and answered questions.Continue aspirin 81 mg daily and clopidogrel 75 mg daily  for 3 months and then stop clopidogrel and stay on aspirin alone secondary stroke prevention and maintain strict control of hypertension with blood pressure goal below 130/90, diabetes with hemoglobin A1c goal below 6.5% and lipids with LDL cholesterol goal below 70 mg/dL. I also advised the patient to eat a healthy diet with plenty of whole grains, cereals, fruits and vegetables, exercise regularly and maintain ideal body weight.  Check 30-day heart monitor for paroxysmal A-fib, TCD bubble  study for PFO and follow-up lipid profile.  Followup in the future with me in 6 months or call earlier if necessary.  Greater than 50% time during this 45-minute consultation visit was spent in counseling and coordination of care about her medullary infarct and discussion about vertebral artery occlusion and cryptogenic etiology and answering questions.  Delia Heady, MD Note: This document was prepared with digital dictation and possible smart phrase technology. Any transcriptional errors that result from this process are unintentional.

## 2023-05-02 NOTE — Patient Instructions (Signed)
 I had a long d/w patient about his recent medullary stroke, vertebral artery occlusion/dissection,risk for recurrent stroke/TIAs, personally independently reviewed imaging studies and stroke evaluation results and answered questions.Continue aspirin 81 mg daily and clopidogrel 75 mg daily  for 3 months and then stop clopidogrel and stay on aspirin alone secondary stroke prevention and maintain strict control of hypertension with blood pressure goal below 130/90, diabetes with hemoglobin A1c goal below 6.5% and lipids with LDL cholesterol goal below 70 mg/dL. I also advised the patient to eat a healthy diet with plenty of whole grains, cereals, fruits and vegetables, exercise regularly and maintain ideal body weight.  Check 30-day heart monitor for paroxysmal A-fib, TCD bubble study for PFO and follow-up lipid profile.  Followup in the future with me in 6 months or call earlier if necessary.  Stroke Prevention Some medical conditions and behaviors can lead to a higher chance of having a stroke. You can help prevent a stroke by eating healthy, exercising, not smoking, and managing any medical conditions you have. Stroke is a leading cause of functional impairment. Primary prevention is particularly important because a majority of strokes are first-time events. Stroke changes the lives of not only those who experience a stroke but also their family and other caregivers. How can this condition affect me? A stroke is a medical emergency and should be treated right away. A stroke can lead to brain damage and can sometimes be life-threatening. If a person gets medical treatment right away, there is a better chance of surviving and recovering from a stroke. What can increase my risk? The following medical conditions may increase your risk of a stroke: Cardiovascular disease. High blood pressure (hypertension). Diabetes. High cholesterol. Sickle cell disease. Blood clotting disorders (hypercoagulable  state). Obesity. Sleep disorders (obstructive sleep apnea). Other risk factors include: Being older than age 38. Having a history of blood clots, stroke, or mini-stroke (transient ischemic attack, TIA). Genetic factors, such as race, ethnicity, or a family history of stroke. Smoking cigarettes or using other tobacco products. Taking birth control pills, especially if you also use tobacco. Heavy use of alcohol or drugs, especially cocaine and methamphetamine. Physical inactivity. What actions can I take to prevent this? Manage your health conditions High cholesterol levels. Eating a healthy diet is important for preventing high cholesterol. If cholesterol cannot be managed through diet alone, you may need to take medicines. Take any prescribed medicines to control your cholesterol as told by your health care provider. Hypertension. To reduce your risk of stroke, try to keep your blood pressure below 130/80. Eating a healthy diet and exercising regularly are important for controlling blood pressure. If these steps are not enough to manage your blood pressure, you may need to take medicines. Take any prescribed medicines to control hypertension as told by your health care provider. Ask your health care provider if you should monitor your blood pressure at home. Have your blood pressure checked every year, even if your blood pressure is normal. Blood pressure increases with age and some medical conditions. Diabetes. Eating a healthy diet and exercising regularly are important parts of managing your blood sugar (glucose). If your blood sugar cannot be managed through diet and exercise, you may need to take medicines. Take any prescribed medicines to control your diabetes as told by your health care provider. Get evaluated for obstructive sleep apnea. Talk to your health care provider about getting a sleep evaluation if you snore a lot or have excessive sleepiness. Make sure that any other  medical conditions you have, such as atrial fibrillation or atherosclerosis, are managed. Nutrition Follow instructions from your health care provider about what to eat or drink to help manage your health condition. These instructions may include: Reducing your daily calorie intake. Limiting how much salt (sodium) you use to 1,500 milligrams (mg) each day. Using only healthy fats for cooking, such as olive oil, canola oil, or sunflower oil. Eating healthy foods. You can do this by: Choosing foods that are high in fiber, such as whole grains, and fresh fruits and vegetables. Eating at least 5 servings of fruits and vegetables a day. Try to fill one-half of your plate with fruits and vegetables at each meal. Choosing lean protein foods, such as lean cuts of meat, poultry without skin, fish, tofu, beans, and nuts. Eating low-fat dairy products. Avoiding foods that are high in sodium. This can help lower blood pressure. Avoiding foods that have saturated fat, trans fat, and cholesterol. This can help prevent high cholesterol. Avoiding processed and prepared foods. Counting your daily carbohydrate intake.  Lifestyle If you drink alcohol: Limit how much you have to: 0-1 drink a day for women who are not pregnant. 0-2 drinks a day for men. Know how much alcohol is in your drink. In the [place] one drink equals one 12 oz bottle of beer ( ), one 5 oz glass of wine ( ), or one 1 oz glass of hard liquor (44mL). Do not use any products that contain nicotine or tobacco. These products include cigarettes, chewing tobacco, and vaping devices, such as e-cigarettes. If you need help quitting, ask your health care provider. Avoid secondhand smoke. Do not use drugs. Activity  Try to stay at a healthy weight. Get at least 30 minutes of exercise on most days, such as: Fast walking. Biking. Swimming. Medicines Take over-the-counter and prescription medicines only as told by your health care  provider. Aspirin or blood thinners (antiplatelets or anticoagulants) may be recommended to reduce your risk of forming blood clots that can lead to stroke. Avoid taking birth control pills. Talk to your health care provider about the risks of taking birth control pills if: You are over 9 years old. You smoke. You get very bad headaches. You have had a blood clot. Where to find more information American Stroke Association: www.strokeassociation.org Get help right away if: You or a loved one has any symptoms of a stroke. "BE FAST" is an easy way to remember the main warning signs of a stroke: B - Balance. Signs are dizziness, sudden trouble walking, or loss of balance. E - Eyes. Signs are trouble seeing or a sudden change in vision. F - Face. Signs are sudden weakness or numbness of the face, or the face or eyelid drooping on one side. A - Arms. Signs are weakness or numbness in an arm. This happens suddenly and usually on one side of the body. S - Speech. Signs are sudden trouble speaking, slurred speech, or trouble understanding what people say. T - Time. Time to call emergency services. Write down what time symptoms started. You or a loved one has other signs of a stroke, such as: A sudden, severe headache with no known cause. Nausea or vomiting. Seizure. These symptoms may represent a serious problem that is an emergency. Do not wait to see if the symptoms will go away. Get medical help right away. Call your local emergency services (911 in the [place] Do not drive yourself to the hospital. Summary You can help to prevent a stroke  by eating healthy, exercising, not smoking, limiting alcohol intake, and managing any medical conditions you may have. Do not use any products that contain nicotine or tobacco. These include cigarettes, chewing tobacco, and vaping devices, such as e-cigarettes. If you need help quitting, ask your health care provider. Remember "BE FAST" for warning signs of a  stroke. Get help right away if you or a loved one has any of these signs. This information is not intended to replace advice given to you by your health care provider. Make sure you discuss any questions you have with your health care provider. Document Revised: 01/16/2022 Document Reviewed: 01/16/2022 Elsevier Patient Education  2024 [employer].

## 2023-05-03 LAB — LIPID PANEL
Chol/HDL Ratio: 2.9 ratio (ref 0.0–5.0)
Cholesterol, Total: 117 mg/dL (ref 100–199)
HDL: 41 mg/dL (ref >39)
LDL Chol Calc (NIH): 64 mg/dL (ref 0–99)
Triglycerides: 55 mg/dL (ref 0–149)
VLDL Cholesterol Cal: 12 mg/dL (ref 5–40)

## 2023-05-15 ENCOUNTER — Ambulatory Visit (HOSPITAL_COMMUNITY)
Admission: RE | Admit: 2023-05-15 | Discharge: 2023-05-15 | Disposition: A | Discharge status: Home / Self Care | Source: Ambulatory Visit | Attending: Neurology | Admitting: Neurology

## 2023-05-15 DIAGNOSIS — I639 Cerebral infarction, unspecified: Secondary | ICD-10-CM | POA: Diagnosis not present

## 2023-05-17 NOTE — Progress Notes (Signed)
 Kindly inform the patient that transplant Doppler bubble study showed no evidence of right-to-left shunt

## 2023-05-17 NOTE — Telephone Encounter (Signed)
-----   Message from Delia Heady sent at 05/17/2023  4:01 PM EDT ----- Joneen Roach inform the patient that transplant Doppler bubble study showed no evidence of right-to-left shunt

## 2023-05-17 NOTE — Telephone Encounter (Signed)
 Spoke to patient gave Doppler bubble study results pt expressed understanding and thanked me for calling

## 2023-07-17 ENCOUNTER — Other Ambulatory Visit (HOSPITAL_BASED_OUTPATIENT_CLINIC_OR_DEPARTMENT_OTHER): Payer: Self-pay | Admitting: Family Medicine

## 2023-08-07 ENCOUNTER — Encounter (HOSPITAL_BASED_OUTPATIENT_CLINIC_OR_DEPARTMENT_OTHER): Payer: BC Managed Care – PPO | Admitting: Family Medicine

## 2023-08-20 ENCOUNTER — Other Ambulatory Visit (HOSPITAL_BASED_OUTPATIENT_CLINIC_OR_DEPARTMENT_OTHER): Payer: Self-pay | Admitting: Family Medicine

## 2023-09-17 ENCOUNTER — Other Ambulatory Visit (HOSPITAL_BASED_OUTPATIENT_CLINIC_OR_DEPARTMENT_OTHER): Payer: Self-pay | Admitting: Family Medicine

## 2023-10-10 ENCOUNTER — Encounter (HOSPITAL_BASED_OUTPATIENT_CLINIC_OR_DEPARTMENT_OTHER): Payer: Self-pay | Admitting: Family Medicine

## 2023-10-10 ENCOUNTER — Ambulatory Visit (INDEPENDENT_AMBULATORY_CARE_PROVIDER_SITE_OTHER): Admitting: Family Medicine

## 2023-10-10 VITALS — BP 126/84 | HR 105 | Ht 71.0 in | Wt 170.0 lb

## 2023-10-10 DIAGNOSIS — Z0000 Encounter for general adult medical examination without abnormal findings: Secondary | ICD-10-CM

## 2023-10-10 DIAGNOSIS — E041 Nontoxic single thyroid nodule: Secondary | ICD-10-CM | POA: Diagnosis not present

## 2023-10-10 NOTE — Progress Notes (Signed)
 Subjective:   Curtis Grimes [birth year] 10/10/2023  CC: Chief Complaint  Patient presents with   Annual Exam    Patient is here today for his physical.    HPI: Curtis Grimes is a 38 y.o. male who presents for a routine health maintenance exam. He is doing well and exercises daily with weight lifting and cardio workout. He is eating a balanced diet. He has followed up with Neuro in March 2025 s/p CVA and has been able to stop his Plavix . He continues to take his Aspirin  81mg  and Atorvastatin  20mg  daily. LDL is at goal less than 70 and he checks his BP with normal readings at home.   Labs collected at time of visit.   HEALTH SCREENINGS: - Vision Screening: up to date - Dental Visits: up to date - Testicular Exam: Declined - STD Screening: Declined - PSA (50+): Not applicable  No results found for: PSA1, PSA   - Colonoscopy (45+): Not applicable  Discussed with patient purpose of the colonoscopy is to detect colon cancer at curable precancerous or early stages  - AAA Screening: Not applicable  Men age 62-75 who have ever smoked - Lung Cancer screening with low-dose CT: Not applicable-  Adults age 39-80 who are current cigarette smokers or quit within the last 15 years. Must have 20 pack year history.   Depression and Anxiety Screen done today and results listed below:     10/10/2023    1:07 PM 04/09/2023   10:05 AM 03/23/2023   11:17 AM 02/11/2020    1:05 PM 09/26/2017   11:31 AM  Depression screen PHQ 2/9  Decreased Interest 0 0 0 0 0  Down, Depressed, Hopeless 0 0 0 0 0  PHQ - 2 Score 0 0 0 0 0  Altered sleeping 0 0 0    Tired, decreased energy 0 0 0    Change in appetite 0 0 0    Feeling bad or failure about yourself  0 0 0    Trouble concentrating 0 0 0    Moving slowly or fidgety/restless 0 0 0    Suicidal thoughts 0  0    PHQ-9 Score 0 0 0    Difficult doing work/chores Not difficult at all Not difficult at all Not difficult at all        10/10/2023    1:08  PM 04/09/2023   10:05 AM 03/23/2023   11:18 AM  GAD 7 : Generalized Anxiety Score  Nervous, Anxious, on Edge 0 0 0  Control/stop worrying 0 0 0  Worry too much - different things 0 0 0  Trouble relaxing 0 0 0  Restless 0 0 0  Easily annoyed or irritable 0 0 0  Afraid - awful might happen 0 0 0  Total GAD 7 Score 0 0 0  Anxiety Difficulty Not difficult at all Not difficult at all Not difficult at all    IMMUNIZATIONS:  - Tdap: Tetanus vaccination status reviewed: last tetanus booster within 10 years. - Influenza: Postponed to flu season - Pneumovax: Not applicable - Prevnar: Not applicable - Shingrix vaccine (50+): Not applicable   Past medical history, surgical history, medications, allergies, family history and social history reviewed with patient today and changes made to appropriate areas of the chart.   Past Medical History:  Diagnosis Date   Allergy     Phreesia 02/11/2020   Depression    Dysphagia    Eosinophilic esophagitis    Meningitis  Past Surgical History:  Procedure Laterality Date   CAUTERIZE INNER NOSE     childhood   VASECTOMY      Current Outpatient Medications on File Prior to Visit  Medication Sig   aspirin  EC 81 MG tablet Take 1 tablet (81 mg total) by mouth daily. Swallow whole.   atorvastatin  (LIPITOR) 20 MG tablet Take 1 tablet by mouth once daily   No current facility-administered medications on file prior to visit.    Allergies  Allergen Reactions   Other Swelling    [place]    Shellfish Allergy  Swelling   Beef-Derived Drug Products    Thick-It Chicken A [place] King [Nutritional Supplements]     Chicken      Social History   Socioeconomic History   Marital status: Divorced    Spouse name: Not on file   Number of children: 2   Years of education: Not on file   Highest education level: Bachelor's degree (e.g., BA, AB, BS)  Occupational History   Not on file  Tobacco Use   Smoking status: Never   Smokeless tobacco: Never   Vaping Use   Vaping status: Never Used  Substance and Sexual Activity   Alcohol use: Yes    Comment: occ   Drug use: Never   Sexual activity: Yes  Other Topics Concern   Not on file  Social History Narrative   Pt lives alone    Pt works    Social Drivers of Corporate investment banker Strain: Low Risk  (03/23/2023)   Overall Financial Resource Strain (CARDIA)    Difficulty of Paying Living Expenses: Not hard at all  Food Insecurity: No Food Insecurity (03/23/2023)   Hunger Vital Sign    Worried About Running Out of Food in the Last Year: Never true    Ran Out of Food in the Last Year: Never true  Transportation Needs: No Transportation Needs (03/23/2023)   PRAPARE - Administrator, Civil Service (Medical): No    Lack of Transportation (Non-Medical): No  Physical Activity: Insufficiently Active (03/23/2023)   Exercise Vital Sign    Days of Exercise per Week: 3 days    Minutes of Exercise per Session: 40 min  Stress: No Stress Concern Present (03/23/2023)   [employer] of Occupational Health - Occupational Stress Questionnaire    Feeling of Stress : Only a little  Social Connections: Socially Isolated (03/23/2023)   Social Connection and Isolation Panel    Frequency of Communication with Friends and Family: More than three times a week    Frequency of Social Gatherings with Friends and Family: Once a week    Attends Religious Services: Never    Database administrator or Organizations: No    Attends Engineer, structural: Not on file    Marital Status: Divorced  Intimate Partner Violence: Not At Risk (03/19/2023)   Humiliation, Afraid, Rape, and Kick questionnaire    Fear of Current or Ex-Partner: No    Emotionally Abused: No    Physically Abused: No    Sexually Abused: No   Social History   Tobacco Use  Smoking Status Never  Smokeless Tobacco Never   Social History   Substance and Sexual Activity  Alcohol Use Yes   Comment: occ      Family History  Problem Relation Age of Onset   Diabetes Mother    Heart disease Mother    Hypertension Mother    Diabetes Maternal Grandmother  Heart disease Maternal Grandmother    Stroke Maternal Grandmother    Hypertension Paternal Grandmother    Colon cancer Neg Hx    Esophageal cancer Neg Hx    Rectal cancer Neg Hx    Stomach cancer Neg Hx      ROS: Denies fever, fatigue, unexplained weight loss/gain, CP, SHOB, and palpatitations. Denies neurological deficits, gastrointestinal and/or genitourinary complaints, and skin changes.   Objective:   Today's Vitals   10/10/23 1305 10/10/23 1327  BP: (!) 150/97 126/84  Pulse: (!) 105   SpO2: 100%   Weight: 170 lb (77.1 kg)   Height: 5' 11 (1.803 m)     GENERAL APPEARANCE: Well-appearing, in NAD. Well nourished.  SKIN: Pink, warm and dry. Turgor normal. No rash, lesion, ulceration, or ecchymoses. Hair evenly distributed.  HEENT: HEAD: Normocephalic.  EYES: PERRLA. EOMI. Lids intact w/o defect. Sclera white, Conjunctiva pink w/o exudate.  EARS: External ear w/o redness, swelling, masses or lesions. EAC clear. TM's intact, translucent w/o bulging, appropriate landmarks visualized. Appropriate acuity to conversational tones.  NOSE: Septum midline w/o deformity. Nares patent, mucosa pink and non-inflamed w/o drainage. No sinus tenderness.  THROAT: Uvula midline. Oropharynx clear. Tonsils non-inflamed w/o exudate. Oral mucosa pink and moist.  NECK: Supple, Trachea midline. Full ROM w/o pain or tenderness. No lymphadenopathy. Thyroid  non-tender w/o enlargement or palpable masses.  RESPIRATORY: Chest wall symmetrical w/o masses. Respirations even and non-labored. Breath sounds clear to auscultation bilaterally. No wheezes, rales, rhonchi, or crackles. CARDIAC: S1, S2 present, regular rate and rhythm. No gallops, murmurs, rubs, or clicks. PMI w/o lifts, heaves, or thrills. No carotid bruits. Capillary refill <2 seconds.  Peripheral pulses 2+ bilaterally. GI: Abdomen soft w/o distention. Normoactive bowel sounds. No palpable masses or tenderness. No guarding or rebound tenderness. Liver and spleen w/o tenderness or enlargement. No CVA tenderness.  GU: Pt deferred exam. MSK: Muscle tone and strength appropriate for age, w/o atrophy or abnormal movement. EXTREMITIES: Active ROM intact, w/o tenderness, crepitus, or contracture. No obvious joint deformities or effusions. No clubbing, edema, or cyanosis.  NEUROLOGIC: CN's II-XII intact. Motor strength symmetrical with no obvious weakness. No sensory deficits. DTR 2+ symmetric bilaterally. Steady, even gait.  PSYCH/MENTAL STATUS: Alert, oriented x 3. Cooperative, appropriate mood and affect.   Results for orders placed or performed in visit on 05/02/23  Lipid panel   Collection Time: 05/02/23 10:04 AM  Result Value Ref Range   Cholesterol, Total 117 100 - 199 mg/dL   Triglycerides 55 0 - 149 mg/dL   HDL 41 >60 mg/dL   VLDL Cholesterol Cal 12 5 - 40 mg/dL   LDL Chol Calc (NIH) 64 0 - 99 mg/dL   Chol/HDL Ratio 2.9 0.0 - 5.0 ratio    Assessment & Plan:  1. Annual physical exam (Primary) Discussed preventative screenings, vaccines, and healthy lifestyle with patient. Will obtain fasting labs today.   - CBC with Differential/Platelet - Comprehensive metabolic panel with GFR  2. Thyroid  nodule Incidental nodule seen on imaging. US  thyroid  in January 2025 recommends follow up US  in 1 year. Patient agreeable to complete study and will be reminded to schedule. Will check TSH with labs today.  - TSH Rfx on Abnormal to Free T4    Orders Placed This Encounter  Procedures   CBC with Differential/Platelet   Comprehensive metabolic panel with GFR   TSH Rfx on Abnormal to Free T4    PATIENT COUNSELING: - Encouraged to adjust caloric intake to maintain or achieve ideal body  weight, to reduce intake of dietary saturated fat and total fat, to limit sodium intake by  avoiding high sodium foods and not adding table salt, and to maintain adequate dietary potassium and calcium  preferably from fresh fruits, vegetables, and low-fat dairy products.   - Advised to avoid cigarette smoking. - Discussed with the patient that most people either abstain from alcohol or drink within safe limits (<=14/week and <=4 drinks/occasion for males, <=7/weeks and <= 3 drinks/occasion for females) and that the risk for alcohol disorders and other health effects rises proportionally with the number of drinks per week and how often a drinker exceeds daily limits. - Discussed cessation/primary prevention of drug use and availability of treatment for abuse.   - Stressed the importance of regular exercise - Injury prevention: Discussed safety belts, safety helmets, smoke detector, smoking near bedding or upholstery.  - Dental health: Discussed importance of regular tooth brushing, flossing, and dental visits.  - Sexuality: Discussed sexually transmitted diseases, partner selection, use of condoms, avoidance of unintended pregnancy  and contraceptive alternatives.   NEXT PREVENTATIVE PHYSICAL DUE IN 1 YEAR.  Return in about 1 year (around 10/09/2024) for ANNUAL PHYSICAL.  Patient to reach out to office if new, worrisome, or unresolved symptoms arise or if no improvement in patient's condition. Patient verbalized understanding and is agreeable to treatment plan. All questions answered to patient's satisfaction.    Thersia Schuyler Stark, [place]

## 2023-10-10 NOTE — Patient Instructions (Signed)

## 2023-10-11 ENCOUNTER — Ambulatory Visit (HOSPITAL_BASED_OUTPATIENT_CLINIC_OR_DEPARTMENT_OTHER): Payer: Self-pay | Admitting: Family Medicine

## 2023-10-11 LAB — CBC WITH DIFFERENTIAL/PLATELET
Basophils Absolute: 0 x10E3/uL (ref 0.0–0.2)
Basos: 0 %
EOS (ABSOLUTE): 0.2 x10E3/uL (ref 0.0–0.4)
Eos: 4 %
Hematocrit: 46.6 % (ref 37.5–51.0)
Hemoglobin: 15.4 g/dL (ref 13.0–17.7)
Immature Grans (Abs): 0 x10E3/uL (ref 0.0–0.1)
Immature Granulocytes: 0 %
Lymphocytes Absolute: 2.2 x10E3/uL (ref 0.7–3.1)
Lymphs: 39 %
MCH: 29.6 pg (ref 26.6–33.0)
MCHC: 33 g/dL (ref 31.5–35.7)
MCV: 89 fL (ref 79–97)
Monocytes Absolute: 0.4 x10E3/uL (ref 0.1–0.9)
Monocytes: 8 %
Neutrophils Absolute: 2.7 x10E3/uL (ref 1.4–7.0)
Neutrophils: 49 %
Platelets: 308 x10E3/uL (ref 150–450)
RBC: 5.21 x10E6/uL (ref 4.14–5.80)
RDW: 13 % (ref 11.6–15.4)
WBC: 5.6 x10E3/uL (ref 3.4–10.8)

## 2023-10-11 LAB — COMPREHENSIVE METABOLIC PANEL WITH GFR
ALT: 31 IU/L (ref 0–44)
AST: 27 IU/L (ref 0–40)
Albumin: 5 g/dL (ref 4.1–5.1)
Alkaline Phosphatase: 87 IU/L (ref 44–121)
BUN/Creatinine Ratio: 7 — ABNORMAL LOW (ref 9–20)
BUN: 6 mg/dL (ref 6–20)
Bilirubin Total: 1 mg/dL (ref 0.0–1.2)
CO2: 24 mmol/L (ref 20–29)
Calcium: 9.8 mg/dL (ref 8.7–10.2)
Chloride: 101 mmol/L (ref 96–106)
Creatinine, Ser: 0.89 mg/dL (ref 0.76–1.27)
Globulin, Total: 2.6 g/dL (ref 1.5–4.5)
Glucose: 96 mg/dL (ref 70–99)
Potassium: 4.2 mmol/L (ref 3.5–5.2)
Sodium: 140 mmol/L (ref 134–144)
Total Protein: 7.6 g/dL (ref 6.0–8.5)
eGFR: 112 mL/min/1.73 (ref >59)

## 2023-10-11 LAB — TSH RFX ON ABNORMAL TO FREE T4: TSH: 0.967 u[IU]/mL (ref 0.450–4.500)

## 2023-10-11 NOTE — Progress Notes (Signed)
 Hi Londyn,  Your blood counts are stable. Your electrolytes, kidney and liver function is within normal limits. Your thyroid  function is also stable. Please keep up the good work!

## 2023-11-27 ENCOUNTER — Ambulatory Visit: Admitting: Physician Assistant

## 2023-11-27 NOTE — Progress Notes (Deleted)
 Ellouise Console, PA-C [address] [place], [place]  [address] Phone: [phone]   Gastroenterology Consultation  Referring Provider:     Knute Thersia Grimes, * Primary Care Physician:  Curtis Thersia Bitters, FNP Primary Gastroenterologist:  Ellouise Console, PA-C / Elspeth Naval, MD  Reason for Consultation:     Dysphagia, eosinophilic esophagitis        HPI:   Curtis Grimes is a 38 y.o. y/o male referred for consultation & management  by Curtis Thersia Bitters, FNP.    12/2018 last EGD by Dr. Naval: Benign-appearing esophageal stenosis at GE junction dilated to 14.5 mm.  Esophageal mucosal changes consistent with active eosinophilic esophagitis in the mid to distal esophagus.  Normal stomach and duodenum.  No current PPI or treatment for EOE.  Current symptoms:  Past Medical History:  Diagnosis Date   Allergy     Phreesia 02/11/2020   Depression    Dysphagia    Eosinophilic esophagitis    Meningitis     Past Surgical History:  Procedure Laterality Date   CAUTERIZE INNER NOSE     childhood   VASECTOMY      Prior to Admission medications   Medication Sig Start Date End Date Taking? Authorizing Provider  aspirin  EC 81 MG tablet Take 1 tablet (81 mg total) by mouth daily. Swallow whole. 03/21/23   Vernon Ranks, MD  atorvastatin  (LIPITOR) 20 MG tablet Take 1 tablet by mouth once daily 09/17/23   Caudle, Thersia Bitters, FNP    Family History  Problem Relation Age of Onset   Diabetes Mother    Heart disease Mother    Hypertension Mother    Diabetes Maternal Grandmother    Heart disease Maternal Grandmother    Stroke Maternal Grandmother    Hypertension Paternal Grandmother    Colon cancer Neg Hx    Esophageal cancer Neg Hx    Rectal cancer Neg Hx    Stomach cancer Neg Hx      Social History   Tobacco Use   Smoking status: Never   Smokeless tobacco: Never  Vaping Use   Vaping status: Never Used  Substance Use Topics   Alcohol use: Yes     Comment: occ   Drug use: Never    Allergies as of 11/27/2023 - Review Complete 10/10/2023  Allergen Reaction Noted   Other Swelling 02/11/2020   Shellfish allergy  Swelling 02/11/2020   Beef-derived drug products  02/11/2020   Thick-it chicken a la king [nutritional supplements]  02/11/2020    Review of Systems:    All systems reviewed and negative except where noted in HPI.   Physical Exam:  There were no vitals taken for this visit. No LMP for male patient.  General:   Alert,  Well-developed, well-nourished, pleasant and cooperative in NAD Lungs:  Respirations even and unlabored.  Clear throughout to auscultation.   No wheezes, crackles, or rhonchi. No acute distress. Heart:  Regular rate and rhythm; no murmurs, clicks, rubs, or gallops. Abdomen:  Normal bowel sounds.  No bruits.  Soft, and non-distended without masses, hepatosplenomegaly or hernias noted.  No Tenderness.  No guarding or rebound tenderness.    Neurologic:  Alert and oriented x3;  grossly normal neurologically. Psych:  Alert and cooperative. Normal mood and affect.  Imaging Studies: No results found.  Labs: CBC    Component Value Date/Time   WBC 5.6 10/10/2023 1332   WBC 7.3 04/02/2023 1550   RBC 5.21 10/10/2023 1332  RBC 5.15 04/02/2023 1550   HGB 15.4 10/10/2023 1332   HCT 46.6 10/10/2023 1332   PLT 308 10/10/2023 1332   MCV 89 10/10/2023 1332    CMP     Component Value Date/Time   NA 140 10/10/2023 1332   K 4.2 10/10/2023 1332   CL 101 10/10/2023 1332   CO2 24 10/10/2023 1332   GLUCOSE 96 10/10/2023 1332   GLUCOSE 97 04/02/2023 1550   BUN 6 10/10/2023 1332   CREATININE 0.89 10/10/2023 1332   CALCIUM  9.8 10/10/2023 1332   PROT 7.6 10/10/2023 1332   ALBUMIN 5.0 10/10/2023 1332   AST 27 10/10/2023 1332   ALT 31 10/10/2023 1332   ALKPHOS 87 10/10/2023 1332   BILITOT 1.0 10/10/2023 1332   GFRNONAA >60 04/02/2023 1550   GFRAA 133 02/11/2020 1420    Assessment and Plan:   Curtis Grimes  is a 38 y.o. y/o male has been referred for   1.  Dysphagia - Schedule repeat EGD with dilation I discussed risks of EGD with patient to include risk of bleeding, perforation, and risk of sedation.  Patient expressed understanding and agrees to proceed with EGD.   2.  Eosinophilic esophagitis - Rx pantoprazole 40 mg twice daily   Follow up ***  Ellouise Console, PA-C

## 2023-12-11 ENCOUNTER — Telehealth: Admitting: Physician Assistant

## 2023-12-11 DIAGNOSIS — J020 Streptococcal pharyngitis: Secondary | ICD-10-CM | POA: Diagnosis not present

## 2023-12-11 MED ORDER — AMOXICILLIN 400 MG/5ML PO SUSR: ORAL | 0 refills | Status: DC

## 2023-12-11 NOTE — Progress Notes (Signed)
 Virtual Visit Consent   Curtis Grimes, you are scheduled for a virtual visit with a [facility] provider today. Just as with appointments in the office, your consent must be obtained to participate. Your consent will be active for this visit and any virtual visit you may have with one of our providers in the next 365 days. If you have a MyChart account, a copy of this consent can be sent to you electronically.  As this is a virtual visit, video technology does not allow for your provider to perform a traditional examination. This may limit your provider's ability to fully assess your condition. If your provider identifies any concerns that need to be evaluated in person or the need to arrange testing (such as labs, EKG, etc.), we will make arrangements to do so. Although advances in technology are sophisticated, we cannot ensure that it will always work on either your end or our end. If the connection with a video visit is poor, the visit may have to be switched to a telephone visit. With either a video or telephone visit, we are not always able to ensure that we have a secure connection.  By engaging in this virtual visit, you consent to the provision of healthcare and authorize for your insurance to be billed (if applicable) for the services provided during this visit. Depending on your insurance coverage, you may receive a charge related to this service.  I need to obtain your verbal consent now. Are you willing to proceed with your visit today? Curtis Grimes has provided verbal consent on 12/11/2023 for a virtual visit (video or telephone). Curtis Grimes, [place]  Date: 12/11/2023 8:54 AM   Virtual Visit via Video Note   I, Curtis Grimes, connected with  KYLLIAN CLINGERMAN  ([id number], May 12, 1985) on 12/11/23 at  8:45 AM EDT by a video-enabled telemedicine application and verified that I am speaking with the correct person using two identifiers.  Location: Patient: Virtual Visit Location  Patient: Home Provider: Virtual Visit Location Provider: Home Office   I discussed the limitations of evaluation and management by telemedicine and the availability of in person appointments. The patient expressed understanding and agreed to proceed.    History of Present Illness: Curtis Grimes is a 38 y.o. who identifies as a male who was assigned male at birth, and is being seen today for severe sore throat with fever, aches, chills. Denies congestion, cough or other URI symptoms. As of this morning noting white patches on his tonsils. Denies any known strep contact.SABRA  OTC -- Nothing.  HPI: HPI  Problems:  Patient Active Problem List   Diagnosis Date Noted   Headache 03/28/2023   Thyroid  nodule 03/26/2023   Status post CVA 03/26/2023   Elevated BP without diagnosis of hypertension 03/26/2023   Acute ischemic stroke (HCC) 03/20/2023   Hypokalemia 03/18/2023   Vertigo 03/18/2023   Eosinophilic esophagitis 01/07/2018   Other allergic rhinitis 01/07/2018    Allergies:  Allergies  Allergen Reactions   Other Swelling    [place]    Shellfish Allergy  Swelling   Bovine (Beef) Protein-Containing Drug Products    Thick-It Chicken [employer] [place] King [Nutritional Supplements]     Chicken    Medications:  Current Outpatient Medications:    amoxicillin (AMOXIL) 400 MG/5ML suspension, Take 6mL PO BID x 10 days., Disp: 120 mL, Rfl: 0   aspirin  EC 81 MG tablet, Take 1 tablet (81 mg total) by mouth daily. Swallow whole., Disp: 30  tablet, Rfl: 12   atorvastatin  (LIPITOR) 20 MG tablet, Take 1 tablet by mouth once daily, Disp: 90 tablet, Rfl: 3  Observations/Objective: Patient is well-developed, well-nourished in no acute distress.  Resting comfortably at home.  Head is normocephalic, atraumatic.  No labored breathing.  Speech is clear and coherent with logical content.  Patient is alert and oriented at baseline.  Posterior oropharyngeal erythema noted with tonsillar swelling (mild). Exudate noted  on R tonsil. Uvula midline and without edema or lesion.  Assessment and Plan: 1. Strep throat (Primary) - amoxicillin (AMOXIL) 400 MG/5ML suspension; Take 6mL PO BID x 10 days.  Dispense: 120 mL; Refill: 0  Supportive measures and OTC medications reviewed. Amox per orders. Follow-up in person for any non-resolving, new or worsening symptoms despite treatment.  Follow Up Instructions: I discussed the assessment and treatment plan with the patient. The patient was provided an opportunity to ask questions and all were answered. The patient agreed with the plan and demonstrated an understanding of the instructions.  A copy of instructions were sent to the patient via MyChart unless otherwise noted below.   The patient was advised to call back or seek an in-person evaluation if the symptoms worsen or if the condition fails to improve as anticipated.    Curtis Velma Lunger, PA-C

## 2023-12-11 NOTE — Patient Instructions (Signed)
 Curtis Grimes, thank you for joining Curtis Velma Lunger, PA-C for today's virtual visit.  While this provider is not your primary care provider (PCP), if your PCP is located in our provider database this encounter information will be shared with them immediately following your visit.   A [facility] MyChart account gives you access to today's visit and all your visits, tests, and labs performed at [facility]  click here if you don't have a [facility] MyChart account or go to [email or web address]  Consent: (Patient) Curtis Grimes provided verbal consent for this virtual visit at the beginning of the encounter.  Current Medications:  Current Outpatient Medications:    aspirin  EC 81 MG tablet, Take 1 tablet (81 mg total) by mouth daily. Swallow whole., Disp: 30 tablet, Rfl: 12   atorvastatin  (LIPITOR) 20 MG tablet, Take 1 tablet by mouth once daily, Disp: 90 tablet, Rfl: 3   Medications ordered in this encounter:  No orders of the defined types were placed in this encounter.    *If you need refills on other medications prior to your next appointment, please contact your pharmacy*  Follow-Up: Call back or seek an in-person evaluation if the symptoms worsen or if the condition fails to improve as anticipated.  [facility] [phone]  Other Instructions Strep Throat, Adult Strep throat is an infection of the throat. It is caused by germs (bacteria). Strep throat is common during the cold months of the year. It mostly affects children who are 18-62 years old. However, people of all ages can get it at any time of the year. This infection spreads from person to person through coughing, sneezing, or having close contact. What are the causes? This condition is caused by the Streptococcus pyogenes germ. What increases the risk? You care for young children. Children are more likely to get strep throat and may spread it to others. You go to crowded places.  Germs can spread easily in such places. You kiss or touch someone who has strep throat. What are the signs or symptoms? Fever or chills. Redness, swelling, or pain in the tonsils or throat. Pain or trouble when swallowing. White or yellow spots on the tonsils or throat. Tender glands in the neck and under the jaw. Bad breath. Red rash all over the body. This is rare. How is this treated? Medicines that kill germs (antibiotics). Medicines that treat pain or fever. These include: Ibuprofen  or acetaminophen . Aspirin , only for people who are over the age of 50. Cough drops. Throat sprays. Follow these instructions at home: Medicines  Take over-the-counter and prescription medicines only as told by your doctor. Take your antibiotic medicine as told by your doctor. Do not stop taking the antibiotic even if you start to feel better. Eating and drinking  If you have trouble swallowing, eat soft foods until your throat feels better. Drink enough fluid to keep your pee (urine) pale yellow. To help with pain, you may have: Warm fluids, such as soup and tea. Cold fluids, such as frozen desserts or popsicles. General instructions Rinse your mouth (gargle) with a salt-water mixture 3-4 times a day or as needed. To make a salt-water mixture, dissolve -1 tsp (3-6 g) of salt in 1 cup (237 mL) of warm water. Rest as much as you can. Stay home from work or school until you have been taking antibiotics for 24 hours. Do not smoke or use any products that contain nicotine or tobacco. If you need help quitting,  ask your doctor. Keep all follow-up visits. How is this prevented?  Do not share food, drinking cups, or personal items. They can cause the germs to spread. Wash your hands well with soap and water. Make sure that all people in your house wash their hands well. Have family members tested if they have a fever or a sore throat. They may need an antibiotic if they have strep throat. Contact a  doctor if: You have swelling in your neck that keeps getting bigger. You get a rash, cough, or earache. You cough up a thick fluid that is green, yellow-brown, or bloody. You have pain that does not get better with medicine. Your symptoms get worse instead of getting better. You have a fever. Get help right away if: You vomit. You have a very bad headache. Your neck hurts or feels stiff. You have chest pain or are short of breath. You have drooling, very bad throat pain, or changes in your voice. Your neck is swollen, or the skin gets red and tender. Your mouth is dry, or you are peeing less than normal. You keep feeling more tired or have trouble waking up. Your joints are red or painful. These symptoms may be an emergency. Do not wait to see if the symptoms will go away. Get help right away. Call your local emergency services (911 in the [place] Summary Strep throat is an infection of the throat. It is caused by germs (bacteria). This infection can spread from person to person through coughing, sneezing, or having close contact. Take your medicines, including antibiotics, as told by your doctor. Do not stop taking the antibiotic even if you start to feel better. To prevent the spread of germs, wash your hands well with soap and water. Have others do the same. Do not share food, drinking cups, or personal items. Get help right away if you have a bad headache, chest pain, shortness of breath, a stiff or painful neck, or you vomit. This information is not intended to replace advice given to you by your health care provider. Make sure you discuss any questions you have with your health care provider. Document Revised: 06/08/2020 Document Reviewed: 06/08/2020 Elsevier Patient Education  2024 Elsevier Inc.   If you have been instructed to have an in-person evaluation today at a local Urgent Care facility, please use the link below. It will take you to a list of all of our available [facility], including address, phone number and hours of operation. Please do not delay care.  [facility]  If you or a family member do not have a primary care provider, use the link below to schedule a visit and establish care. When you choose a [facility] primary care physician or advanced practice provider, you gain a long-term partner in health. Find a Primary Care Provider  Learn more about [facility] in-office and virtual care options:  - Get Care Now

## 2024-01-07 ENCOUNTER — Telehealth: Payer: Self-pay | Admitting: Neurology

## 2024-01-07 NOTE — Telephone Encounter (Signed)
 Per Dr. Rosemarie last OV note 05/02/23:  Followup in the future with me in 6 months or call earlier if necessary.   Olivia: looks like he wanted pt to f/u with him

## 2024-01-07 NOTE — Telephone Encounter (Signed)
 Can this patient see an NP or does he have to see Dr. Rosemarie?

## 2024-01-10 ENCOUNTER — Ambulatory Visit: Admitting: Neurology

## 2024-01-17 ENCOUNTER — Encounter: Payer: Self-pay | Admitting: Physician Assistant

## 2024-01-17 ENCOUNTER — Ambulatory Visit (INDEPENDENT_AMBULATORY_CARE_PROVIDER_SITE_OTHER): Admitting: Physician Assistant

## 2024-01-17 VITALS — BP 128/64 | HR 71 | Ht 71.0 in | Wt 177.0 lb

## 2024-01-17 DIAGNOSIS — K200 Eosinophilic esophagitis: Secondary | ICD-10-CM

## 2024-01-17 DIAGNOSIS — K222 Esophageal obstruction: Secondary | ICD-10-CM

## 2024-01-17 DIAGNOSIS — R1310 Dysphagia, unspecified: Secondary | ICD-10-CM

## 2024-01-17 NOTE — Progress Notes (Signed)
 Curtis Console, PA-C [address] [place], [place]  [address] Phone: [phone]   Gastroenterology Consultation  Referring Provider:     Knute Thersia Bitters, * Primary Care Physician:  Knute Thersia Bitters, FNP Primary Gastroenterologist:  Curtis Console, PA-C / Elspeth Naval, MD  Reason for Consultation:     Trouble swallowing        HPI:   Discussed the use of AI scribe software for clinical note transcription with the patient, who gave verbal consent to proceed.  38 year old male with history of eosinophilic esophagitis and distal esophageal stricture presents for evaluation of recurrent dysphagia.  Not currently on PPI or any treatment for EOE.    History of Present Illness He has a history of eosinophilic esophagitis and esophageal stricture, with the last upper endoscopy and dilation performed in 2020. His symptoms have persisted longer than expected before seeking care.  He experiences difficulty swallowing, even with soft foods, and has had to crush his cholesterol medication to take it with applesauce. A couple of weeks ago, an 81 mg aspirin  got stuck in his chest, which he managed to swallow by stretching his neck. He now keeps carbonated drinks nearby during meals as they help push food down when it feels stuck, a sensation that occurs almost every meal.  He is currently eating softer foods such as pasta, salads, and mashed potatoes to minimize the risk of food getting stuck. He was previously taking omeprazole  but only uses it as needed. No heartburn, indigestion, or unusual weight loss.  He recently required a liquid form of a medication due to difficulty swallowing pills. He denies vomiting food back up and is very careful with his diet to avoid complications. No weight loss has been experienced.  GI HISTORY:  12/2018 last EGD by Dr. Naval: Moderate distal esophageal stricture dilated with balloon dilator to 14.5 mm.  Active eosinophilic esophagitis in  the middle and lower esophagus.  Normal stomach and duodenum.  Was started on Nexium 40 mg daily.  11/2017 EGD by Dr. Naval: Distal esophageal stenosis dilated to 17 mm.  Eosinophilic esophagitis.  10/30/2018 patient saw allergist.  At that time, he had continue to avoid shellfish, pork, turkey, chicken, beef and Lamb.  The plan was to reintroduce beef and lamb first back in the diet followed by pork after his GI appointment.   Past Medical History:  Diagnosis Date   Allergy     Phreesia 02/11/2020   Depression    Dysphagia    Eosinophilic esophagitis    High cholesterol    Meningitis    Stroke (cerebrum) (HCC) 02/2023    Past Surgical History:  Procedure Laterality Date   CAUTERIZE INNER NOSE     childhood   VASECTOMY      Prior to Admission medications   Medication Sig Start Date End Date Taking? Authorizing Provider  amoxicillin  (AMOXIL ) 400 MG/5ML suspension Take 6mL PO BID x 10 days. 12/11/23   Gladis Elsie BROCKS, PA-C  aspirin  EC 81 MG tablet Take 1 tablet (81 mg total) by mouth daily. Swallow whole. 03/21/23   Vernon Ranks, MD  atorvastatin  (LIPITOR) 20 MG tablet Take 1 tablet by mouth once daily 09/17/23   Caudle, Thersia Bitters, FNP    Family History  Problem Relation Age of Onset   Diabetes Mother    Heart disease Mother    Hypertension Mother    Diabetes Maternal Grandmother    Heart disease Maternal Grandmother    Stroke Maternal  Grandmother    Hypertension Paternal Grandmother    Colon cancer Neg Hx    Esophageal cancer Neg Hx    Rectal cancer Neg Hx    Stomach cancer Neg Hx      Social History   Tobacco Use   Smoking status: Never   Smokeless tobacco: Never  Vaping Use   Vaping status: Never Used  Substance Use Topics   Alcohol use: Yes    Comment: occ   Drug use: Never    Allergies as of 01/17/2024 - Review Complete 01/17/2024  Allergen Reaction Noted   Other Swelling 02/11/2020   Shellfish allergy  Swelling 02/11/2020   Bovine (beef)  protein-containing drug products  02/11/2020   Thick-it chicken a la king [nutritional supplements]  02/11/2020    Review of Systems:    All systems reviewed and negative except where noted in HPI.   Physical Exam:  BP 128/64   Pulse 71   Ht 5' 11 (1.803 m)   Wt 177 lb (80.3 kg)   BMI 24.69 kg/m  No LMP for male patient.  General:   Alert,  Well-developed, well-nourished, pleasant and cooperative in NAD Lungs:  Respirations even and unlabored.  Clear throughout to auscultation.   No wheezes, crackles, or rhonchi. No acute distress. Heart:  Regular rate and rhythm; no murmurs, clicks, rubs, or gallops. Abdomen:  Normal bowel sounds.  No bruits.  Soft, and non-distended without masses, hepatosplenomegaly or hernias noted.  No Tenderness.  No guarding or rebound tenderness.    Neurologic:  Alert and oriented x3;  grossly normal neurologically. Psych:  Alert and cooperative. Normal mood and affect.   Imaging Studies: No results found.  Labs: CBC    Component Value Date/Time   WBC 5.6 10/10/2023 1332   WBC 7.3 04/02/2023 1550   RBC 5.21 10/10/2023 1332   RBC 5.15 04/02/2023 1550   HGB 15.4 10/10/2023 1332   HCT 46.6 10/10/2023 1332   PLT 308 10/10/2023 1332   MCV 89 10/10/2023 1332    CMP     Component Value Date/Time   NA 140 10/10/2023 1332   K 4.2 10/10/2023 1332   CL 101 10/10/2023 1332   CO2 24 10/10/2023 1332   GLUCOSE 96 10/10/2023 1332   GLUCOSE 97 04/02/2023 1550   BUN 6 10/10/2023 1332   CREATININE 0.89 10/10/2023 1332   CALCIUM  9.8 10/10/2023 1332   PROT 7.6 10/10/2023 1332   ALBUMIN 5.0 10/10/2023 1332   AST 27 10/10/2023 1332   ALT 31 10/10/2023 1332   ALKPHOS 87 10/10/2023 1332   BILITOT 1.0 10/10/2023 1332   GFRNONAA >60 04/02/2023 1550   GFRAA 133 02/11/2020 1420    Assessment and Plan:   CHANDLOR Grimes is a 38 y.o. y/o male has been referred for:   1.  Recurrent dysphagia 2.  Eosinophilic esophagitis 3.  History of esophageal stricture  last dilated 12/2018. Assessment & Plan Eosinophilic esophagitis with recurrent esophageal stricture and dysphagia. - Discussed long-term treatment options including daily proton pump inhibitors, budesonide , and Dupixent.  I stressed the importance of taking daily treatment for EOE.  Patient education handout was given. - Scheduled repeat EGD with dilation by Dr. Leigh. - I discussed risks of EGD with dilation with patient to include risk of bleeding, perforation, and risk of sedation.  Patient expressed understanding and agrees to proceed with EGD.  - Initiated OTC omeprazole  40 mg daily, either two tablets once a day or one tablet twice a day.  Patient declined prescription PPI. - Discussed potential need for multiple dilations if stricture is severe. - Educated on dietary modifications to manage symptoms.   Follow up office visit in 2 months with Dr. Leigh.  Also follow-up based on EGD results.  Curtis Console, PA-C

## 2024-01-17 NOTE — Patient Instructions (Addendum)
 _______________________________________________________  If your blood pressure at your visit was 140/90 or greater, please contact your primary care physician to follow up on this.  _______________________________________________________  If you are age 38 or older, your body mass index should be between 23-30. Your Body mass index is 24.69 kg/m. If this is out of the aforementioned range listed, please consider follow up with your Primary Care Provider.  If you are age 36 or younger, your body mass index should be between 19-25. Your Body mass index is 24.69 kg/m. If this is out of the aformentioned range listed, please consider follow up with your Primary Care Provider.   ________________________________________________________  The [facility] providers would like to encourage you to use MYCHART to communicate with providers for non-urgent requests or questions.  Due to long hold times on the telephone, sending your provider a message by [facility] may be a faster and more efficient way to get a response.  Please allow 48 business hours for a response.  Please remember that this is for non-urgent requests.  _______________________________________________________  [facility] is using a team-based approach to care.  Your team is made up of your doctor and two to three APPS. Our APPS (Nurse Practitioners and Physician Assistants) work with your physician to ensure care continuity for you. They are fully qualified to address your health concerns and develop a treatment plan. They communicate directly with your gastroenterologist to care for you. Seeing the Advanced Practice Practitioners on your physician's team can help you by facilitating care more promptly, often allowing for earlier appointments, access to diagnostic testing, procedures, and other specialty referrals.   You have been scheduled for an endoscopy. Please follow written instructions given to you at your visit  today.  If you use inhalers (even only as needed), please bring them with you on the day of your procedure.  If you take any of the following medications, they will need to be adjusted prior to your procedure:   DO NOT TAKE 7 DAYS PRIOR TO TEST- Trulicity (dulaglutide) Ozempic, Wegovy (semaglutide) Mounjaro, Zepbound (tirzepatide) Bydureon Bcise (exanatide extended release)  DO NOT TAKE 1 DAY PRIOR TO YOUR TEST Rybelsus (semaglutide) Adlyxin (lixisenatide) Victoza (liraglutide) Byetta (exanatide) ___________________________________________________________________________  Please start taking OTC Omeprazole  20mg , 2 tablets every day.  Due to recent changes in healthcare laws, you may see the results of your imaging and laboratory studies on MyChart before your provider has had a chance to review them.  We understand that in some cases there may be results that are confusing or concerning to you. Not all laboratory results come back in the same time frame and the provider may be waiting for multiple results in order to interpret others.  Please give us  48 hours in order for your provider to thoroughly review all the results before contacting the office for clarification of your results.

## 2024-01-17 NOTE — Progress Notes (Signed)
 Agree with assessment and plan as outlined.

## 2024-01-18 ENCOUNTER — Ambulatory Visit: Admitting: Gastroenterology

## 2024-01-18 ENCOUNTER — Telehealth: Payer: Self-pay | Admitting: Gastroenterology

## 2024-01-18 ENCOUNTER — Encounter: Payer: Self-pay | Admitting: Gastroenterology

## 2024-01-18 VITALS — BP 142/84 | HR 110 | Temp 98.3°F | Ht 71.0 in | Wt 177.0 lb

## 2024-01-18 DIAGNOSIS — K200 Eosinophilic esophagitis: Secondary | ICD-10-CM

## 2024-01-18 DIAGNOSIS — R1310 Dysphagia, unspecified: Secondary | ICD-10-CM

## 2024-01-18 MED ORDER — SODIUM CHLORIDE 0.9 % IV SOLN: 500.0000 mL | Freq: Once | INTRAVENOUS | Status: DC

## 2024-01-18 NOTE — Telephone Encounter (Signed)
 Patient aware that he was rescheduled for 03-05-24 for EGD with dilation with Dr Leigh in the [facility].   Patient advised to contact our office the week of 02-18-24 if he has not heard from our office regarding holding Plavix .  Patient agreed to plan and verbalized understanding.  No further questions.

## 2024-01-18 NOTE — Telephone Encounter (Signed)
 Thanks Joni Reining

## 2024-01-18 NOTE — Telephone Encounter (Signed)
 No problem Ellouise, it happens.  Nat - may be good to book him for the next available EGD just to have a spot (I think it may be early January) while we are awaiting clearance. If something opens sooner I will try to add him in sooner. Thanks

## 2024-01-18 NOTE — Telephone Encounter (Signed)
 Plavix  letter faxed to Dr Rosemarie and fax confirmation received.

## 2024-01-18 NOTE — Telephone Encounter (Signed)
 Patient arrived for EGD today with plans for dilation of GEJ stricture for EOE.  He was seen in the office yesterday.  I noted when reviewing his chart today that he had a stroke in January and is currently taking Plavix .  For some reason that medication was not listed at the office visit yesterday.  Unfortunately can not proceed with EGD for dilation because he took Plavix  yesterday. Procedure cancelled. I explained what happened and reasons for cancelling. He was very understanding.    POD A RN - patient will need clearance to hold Plavix  for 5 days for an EGD with dilation, if he can reach out to his neurologist to obtain clearance.  He can continue baby aspirin  during that time.  If they clear him to proceed then can reschedule him for EGD at next available opening.  I told him to take his PPI in the interim.  Ellouise - FYI - not sure why Plavix  was not listed at his office visit yesterday, not sure if it was missed during his intake by the CMA reviewing his medications?

## 2024-01-18 NOTE — Progress Notes (Signed)
 Patient was scheduled for an EGD with dilation today after office visit yesterday -he arrived for procedure today.  Looking through his chart he had a stroke in January, has been taking Plavix .  This was not listed on his medications yesterday in the office for some reason.  He took Plavix  yesterday.  Unfortunately if we are planning a dilation of his esophagus (he has had 2 dilations of GEJ stricture in the past), then we cannot proceed with EGD today.  He will need approval from his neurologist to hold Plavix  for 5 days, can continue baby aspirin  during that time.  I apologized for the inconvenience and told him our staff will contact him for rescheduling.  He should continue PPI in the interim and minimize foods that reproduce his symptoms, chew food well.  He will be contacted for rescheduling.

## 2024-01-18 NOTE — Progress Notes (Signed)
 Pt's states no medical or surgical changes since previsit or office visit.

## 2024-01-18 NOTE — Telephone Encounter (Signed)
 Nat please see note below, pt was seen in the office yesterday.

## 2024-01-22 ENCOUNTER — Telehealth: Payer: Self-pay | Admitting: *Deleted

## 2024-01-22 NOTE — Telephone Encounter (Signed)
 Received clearance request from [facility] regarding pt's upcoming upper endoscopy on 03/25/24. They are asking for instructions regarding pt's plavix .   Called patient. He is not on plavix  but is on aspirin  81 mg daily (per Dr Rosemarie). He denies any stroke since his previous one that he was seen for (January 2025). Patient aware clearance request will be given to Dr Rosemarie. He appreciated the call.

## 2024-01-23 NOTE — Telephone Encounter (Signed)
 Clearance paper signed by Dr Rosemarie. Clearance faxed back to [facility] attn: Nat SAUNDERS. Received a receipt of confirmation.  Clearance states:

## 2024-01-23 NOTE — Telephone Encounter (Signed)
 Received fax from Dr Rosemarie stating that patient should no longer be on Plavix , but should continue Aspirin .  Dr Rosemarie,  advised that if patient is willing to come off of Aspirin  3 months after stroke, he could 3 days prior to the procedure and resume after the procedure when safe.    Please advise if you prefer patient to continue Aspirin  or would like the patient to hold Aspirin  3 days prior to upper endoscopy.             01/23/24  9:01 AM Hilliard Heather CROME, RN contacted [facility] (Fax)

## 2024-01-23 NOTE — Telephone Encounter (Signed)
 Patient advised OK to remain on low-dose baby aspirin  during EGD.  Patient agreed to plan and verbalized understanding.  No further questions.

## 2024-03-05 ENCOUNTER — Ambulatory Visit: Admitting: Gastroenterology

## 2024-03-05 ENCOUNTER — Encounter: Payer: Self-pay | Admitting: Gastroenterology

## 2024-03-05 VITALS — BP 124/86 | HR 86 | Temp 98.6°F | Resp 18 | Ht 71.0 in | Wt 177.0 lb

## 2024-03-05 DIAGNOSIS — K2289 Other specified disease of esophagus: Secondary | ICD-10-CM

## 2024-03-05 DIAGNOSIS — K222 Esophageal obstruction: Secondary | ICD-10-CM | POA: Diagnosis present

## 2024-03-05 DIAGNOSIS — R1310 Dysphagia, unspecified: Secondary | ICD-10-CM

## 2024-03-05 DIAGNOSIS — K200 Eosinophilic esophagitis: Secondary | ICD-10-CM

## 2024-03-05 MED ORDER — SODIUM CHLORIDE 0.9 % IV SOLN: 500.0000 mL | Freq: Once | INTRAVENOUS | Status: DC

## 2024-03-05 NOTE — Progress Notes (Signed)
 [facility] History and Physical   Primary Care Physician:  Knute Thersia Bitters, FNP   Reason for Procedure:   EoE, esophageal stricture / dysphagia  Plan:    EGD with dilation      HPI: Curtis Grimes is a 39 y.o. male  here for EGD to evaluate dysphagia secondary to known esophageal stricture / EoE. Last exam in 2020, had distal esophageal stricture dilated to 14.24mm. Previously scheduled for a dilation but was on Plavix  so his case was postponed. Taking omeprazole  20mg  / day currently which seems to be helping reflux but dysphagia persists.    Otherwise feels well without any cardiopulmonary symptoms.   I have discussed risks / benefits of anesthesia and endoscopic procedure with Curtis Grimes and they wish to proceed with the exams as outlined today.   The patient was provided an opportunity to ask questions and all were answered. The patient agreed with the plan.    Past Medical History:  Diagnosis Date   Allergy     Phreesia 02/11/2020   Depression    Dysphagia    Eosinophilic esophagitis    High cholesterol    Meningitis    Stroke (cerebrum) (HCC) 02/2023    Past Surgical History:  Procedure Laterality Date   CAUTERIZE INNER NOSE     childhood   VASECTOMY      Prior to Admission medications  Medication Sig Start Date End Date Taking? Authorizing Provider  aspirin  EC 81 MG tablet Take 1 tablet (81 mg total) by mouth daily. Swallow whole. 03/21/23  Yes Vernon Ranks, MD  atorvastatin  (LIPITOR) 20 MG tablet Take 1 tablet by mouth once daily 09/17/23  Yes Caudle, Alexis Olivia, FNP  clopidogrel  (PLAVIX ) 75 MG tablet Take 75 mg by mouth daily. Patient not taking: Reported on 03/05/2024 01/03/24   [provider]    Current Outpatient Medications  Medication Sig Dispense Refill   aspirin  EC 81 MG tablet Take 1 tablet (81 mg total) by mouth daily. Swallow whole. 30 tablet 12   atorvastatin  (LIPITOR) 20 MG tablet Take 1 tablet by mouth once daily  90 tablet 3   clopidogrel  (PLAVIX ) 75 MG tablet Take 75 mg by mouth daily. (Patient not taking: Reported on 03/05/2024)     Current Facility-Administered Medications  Medication Dose Route Frequency Provider Last Rate Last Admin   0.9 %  sodium chloride  infusion  500 mL Intravenous Once Ishita Mcnerney, Elspeth SQUIBB, MD       0.9 %  sodium chloride  infusion  500 mL Intravenous Once Luria Rosario, Elspeth SQUIBB, MD        Allergies as of 03/05/2024 - Review Complete 03/05/2024  Allergen Reaction Noted   Bovine (beef) protein-containing drug products Swelling 02/11/2020   Other Swelling 02/11/2020   Shellfish allergy  Swelling 02/11/2020   Thick-it chicken a la king [nutritional supplements] Swelling 02/11/2020    Family History  Problem Relation Age of Onset   Diabetes Mother    Heart disease Mother    Hypertension Mother    Diabetes Maternal Grandmother    Heart disease Maternal Grandmother    Stroke Maternal Grandmother    Hypertension Paternal Grandmother    Colon cancer Neg Hx    Esophageal cancer Neg Hx    Rectal cancer Neg Hx    Stomach cancer Neg Hx     Social History   Socioeconomic History   Marital status: Divorced    Spouse name: Not on file   Number of children: 2  Years of education: Not on file   Highest education level: Bachelor's degree (e.g., BA, AB, BS)  Occupational History   Not on file  Tobacco Use   Smoking status: Never   Smokeless tobacco: Never  Vaping Use   Vaping status: Never Used  Substance and Sexual Activity   Alcohol use: Yes    Comment: occ   Drug use: Never   Sexual activity: Yes  Other Topics Concern   Not on file  Social History Narrative   Pt lives alone    Pt works    Social Drivers of Health   Tobacco Use: Low Risk (03/05/2024)   Patient History    Smoking Tobacco Use: Never    Smokeless Tobacco Use: Never    Passive Exposure: Not on file  Financial Resource Strain: Low Risk (03/23/2023)   Overall Financial Resource Strain (CARDIA)     Difficulty of Paying Living Expenses: Not hard at all  Food Insecurity: No Food Insecurity (03/23/2023)   Hunger Vital Sign    Worried About Running Out of Food in the Last Year: Never true    Ran Out of Food in the Last Year: Never true  Transportation Needs: No Transportation Needs (03/23/2023)   PRAPARE - Administrator, Civil Service (Medical): No    Lack of Transportation (Non-Medical): No  Physical Activity: Insufficiently Active (03/23/2023)   Exercise Vital Sign    Days of Exercise per Week: 3 days    Minutes of Exercise per Session: 40 min  Stress: No Stress Concern Present (03/23/2023)   [employer] of Occupational Health - Occupational Stress Questionnaire    Feeling of Stress : Only a little  Social Connections: Socially Isolated (03/23/2023)   Social Connection and Isolation Panel    Frequency of Communication with Friends and Family: More than three times a week    Frequency of Social Gatherings with Friends and Family: Once a week    Attends Religious Services: Never    Database Administrator or Organizations: No    Attends Engineer, Structural: Not on file    Marital Status: Divorced  Intimate Partner Violence: Not At Risk (03/19/2023)   Humiliation, Afraid, Rape, and Kick questionnaire    Fear of Current or Ex-Partner: No    Emotionally Abused: No    Physically Abused: No    Sexually Abused: No  Depression (PHQ2-9): Low Risk (10/10/2023)   Depression (PHQ2-9)    PHQ-2 Score: 0  Alcohol Screen: Low Risk (04/09/2023)   Alcohol Screen    Last Alcohol Screening Score (AUDIT): 0  Housing: Low Risk (03/23/2023)   Housing Stability Vital Sign    Unable to Pay for Housing in the Last Year: No    Number of Times Moved in the Last Year: 0    Homeless in the Last Year: No  Utilities: Not At Risk (03/19/2023)   AHC Utilities    Threatened with loss of utilities: No  Health Literacy: Adequate Health Literacy (04/09/2023)   B1300 Health Literacy     Frequency of need for help with medical instructions: Never    Review of Systems: All other review of systems negative except as mentioned in the HPI.  Physical Exam: Vital signs BP (!) 136/95   Pulse 100   Temp 98.6 F (37 C)   Ht 5' 11 (1.803 m)   Wt 177 lb (80.3 kg)   SpO2 98%   BMI 24.69 kg/m   General:   Alert,  Well-developed, pleasant and cooperative in NAD Lungs:  Clear throughout to auscultation.   Heart:  Regular rate and rhythm Abdomen:  Soft, nontender and nondistended.   Neuro/Psych:  Alert and cooperative. Normal mood and affect. A and O x 3  Marcey Naval, MD [facility]

## 2024-03-05 NOTE — Progress Notes (Signed)
 Pt's states no medical or surgical changes since previsit or office visit. Plavix  was discontinued per patient in November.

## 2024-03-05 NOTE — Progress Notes (Signed)
 Called to room to assist during endoscopic procedure.  Patient ID and intended procedure confirmed with present staff. Received instructions for my participation in the procedure from the performing physician.

## 2024-03-05 NOTE — Patient Instructions (Signed)
 Post dilation diet. Generally remain on soft diet until additional dilation can be performed.  Continue present medications (omeprazole ) Await pathology results.  Repeat EGD in 2-3 weeks for additional dilation.  YOU HAD AN ENDOSCOPIC PROCEDURE TODAY AT THE [facility]:   Refer to the procedure report that was given to you for any specific questions about what was found during the examination.  If the procedure report does not answer your questions, please call your gastroenterologist to clarify.  If you requested that your care partner not be given the details of your procedure findings, then the procedure report has been included in a sealed envelope for you to review at your convenience later.  YOU SHOULD EXPECT: Some feelings of bloating in the abdomen. Passage of more gas than usual.  Walking can help get rid of the air that was put into your GI tract during the procedure and reduce the bloating. If you had a lower endoscopy (such as a colonoscopy or flexible sigmoidoscopy) you may notice spotting of blood in your stool or on the toilet paper. If you underwent a bowel prep for your procedure, you may not have a normal bowel movement for a few days.  Please Note:  You might notice some irritation and congestion in your nose or some drainage.  This is from the oxygen used during your procedure.  There is no need for concern and it should clear up in a day or so.  SYMPTOMS TO REPORT IMMEDIATELY: Following upper endoscopy (EGD)  Vomiting of blood or coffee ground material  New chest pain or pain under the shoulder blades  Painful or persistently difficult swallowing  New shortness of breath  Fever of 100F or higher  Black, tarry-looking stools  For urgent or emergent issues, a gastroenterologist can be reached at any hour by calling [phone]. Do not use MyChart messaging for urgent concerns.   DIET:  We do recommend a small meal at first, but then you may proceed to your  regular diet.  Drink plenty of fluids but you should avoid alcoholic beverages for 24 hours.  ACTIVITY:  You should plan to take it easy for the rest of today and you should NOT DRIVE or use heavy machinery until tomorrow (because of the sedation medicines used during the test).    FOLLOW UP: Our staff will call the number listed on your records the next business day following your procedure.  We will call around 7:15- 8:00 am to check on you and address any questions or concerns that you may have regarding the information given to you following your procedure. If we do not reach you, we will leave a message.     If any biopsies were taken you will be contacted by phone or by letter within the next 1-3 weeks.  Please call us  at [phone] if you have not heard about the biopsies in 3 weeks.   SIGNATURES/CONFIDENTIALITY: You and/or your care partner have signed paperwork which will be entered into your electronic medical record.  These signatures attest to the fact that that the information above on your After Visit Summary has been reviewed and is understood.  Full responsibility of the confidentiality of this discharge information lies with you and/or your care-partner.

## 2024-03-05 NOTE — Op Note (Signed)
  [facility] Patient Name: Curtis Grimes Procedure Date: 03/05/2024 2:18 PM MRN: [id number] Endoscopist: Elspeth P. Leigh , MD, [id number] Age: 39 Referring MD:  Date of Birth: 1985/08/03 Gender: Male Account #: [id number] Procedure:                Upper GI endoscopy Indications:              Dysphagia, follow-up of eosinophilic esophagitis -                            recently has resumed omeprazole  20mg . He is                            avoiding known food allergens. Medicines:                Monitored Anesthesia Care Procedure:                Pre-Anesthesia Assessment:                           - Prior to the procedure, a History and Physical                            was performed, and patient medications and                            allergies were reviewed. The patient's tolerance of                            previous anesthesia was also reviewed. The risks                            and benefits of the procedure and the sedation                            options and risks were discussed with the patient.                            All questions were answered, and informed consent                            was obtained. Prior Anticoagulants: The patient has                            taken no anticoagulant or antiplatelet agents. ASA                            Grade Assessment: III - A patient with severe                            systemic disease. After reviewing the risks and                            benefits, the patient was deemed in satisfactory  condition to undergo the procedure.                           After obtaining informed consent, the endoscope was                            passed under direct vision. Throughout the                            procedure, the patient's blood pressure, pulse, and                            oxygen saturations were monitored continuously. The                            Olympus scope [id number]  was introduced through the                            mouth, and advanced to the second part of duodenum.                            The upper GI endoscopy was accomplished without                            difficulty. The patient tolerated the procedure                            well. Scope In: Scope Out: Findings:                 Esophagogastric landmarks were identified: the                            Z-line was found at 41 cm, the gastroesophageal                            junction was found at 41 cm and the upper extent of                            the gastric folds was found at 41 cm from the                            incisors.                           One benign-appearing, intrinsic severe stenosis was                            found 41 cm from the incisors. This stenosis                            measured less than one cm (in length) and roughly                            6-29mm in  diameter. The stenosis was traversed with                            the endoscope with mild resistance. The stricture                            was dilated with passage of the upper endoscope                            itself, no further dilation was performed given                            dilation effect seen following passage of the scope.                           Mucosal changes including subtle ringed esophagus                            and subtle longitudinal furrows were found in the                            entire esophagus. Biopsies were obtained from the                            proximal and distal esophagus with cold forceps for                            activity of suspected eosinophilic esophagitis.                           The exam of the esophagus was otherwise normal.                           The entire examined stomach was normal.                           The examined duodenum was normal. Complications:            No immediate complications. Estimated blood loss:                             Minimal. Estimated Blood Loss:     Estimated blood loss was minimal. Impression:               - Esophagogastric landmarks identified.                           - Benign-appearing esophageal stenosis at the GEJ -                            dilated with the upper endoscope itself.                           - Esophageal mucosal changes suspicious for mild  eosinophilic esophagitis.                           - Normal esophagus otherwise.                           - Normal stomach.                           - Normal examined duodenum. Recommendation:           - Patient has a contact number available for                            emergencies. The signs and symptoms of potential                            delayed complications were discussed with the                            patient. Return to normal activities tomorrow.                            Written discharge instructions were provided to the                            patient.                           - Post dilation diet. Generally remain on soft diet                            until additional dilation can be performed given                            severity of the stenosis appreciated.                           - Continue present medications (omeprazole )                           - Await pathology results. Consideration for adding                            steroids pending pathology results (EoE does seem                            mild)                           - Repeat EGD in 2-3 weeks for additional dilation                            given severity of stenosis noted on this exam. Elspeth SQUIBB. Jeryl Wilbourn, MD 03/05/2024 2:51:33 PM This report has been signed electronically.

## 2024-03-05 NOTE — Progress Notes (Signed)
 Transferred to PACU via stretcher. Patient arousing to stimulation.  VSS upon leaving procedure room.

## 2024-03-06 ENCOUNTER — Telehealth: Payer: Self-pay

## 2024-03-06 NOTE — Telephone Encounter (Signed)
" °  Follow up Call-     03/05/2024    1:37 PM 01/18/2024    8:04 AM  Call back number  Post procedure Call Back phone  # [phone]  Permission to leave phone message Yes Yes     Patient questions:  Do you have a fever, pain , or abdominal swelling? No. Pain Score  0 *  Have you tolerated food without any problems? Yes.    Have you been able to return to your normal activities? Yes.    Do you have any questions about your discharge instructions: Diet   No. Medications  No. Follow up visit  No.  Do you have questions or concerns about your Care? No.  Actions: * If pain score is 4 or above: No action needed, pain <4.   "

## 2024-03-10 ENCOUNTER — Ambulatory Visit: Payer: Self-pay | Admitting: Gastroenterology

## 2024-03-10 LAB — SURGICAL PATHOLOGY

## 2024-03-17 ENCOUNTER — Encounter (HOSPITAL_BASED_OUTPATIENT_CLINIC_OR_DEPARTMENT_OTHER): Payer: Self-pay | Admitting: Family Medicine

## 2024-03-18 ENCOUNTER — Telehealth: Payer: Self-pay | Admitting: Neurology

## 2024-03-18 NOTE — Telephone Encounter (Signed)
 LVM and mychart informing pt needs to reschedule his 2/16 appt.. MD out

## 2024-03-20 ENCOUNTER — Encounter: Payer: Self-pay | Admitting: Gastroenterology

## 2024-03-20 ENCOUNTER — Ambulatory Visit: Admitting: Gastroenterology

## 2024-03-20 ENCOUNTER — Telehealth: Payer: Self-pay | Admitting: Gastroenterology

## 2024-03-20 VITALS — BP 104/70 | HR 85 | Temp 99.5°F | Resp 17 | Ht 71.0 in | Wt 177.0 lb

## 2024-03-20 DIAGNOSIS — K222 Esophageal obstruction: Secondary | ICD-10-CM | POA: Diagnosis present

## 2024-03-20 DIAGNOSIS — R1310 Dysphagia, unspecified: Secondary | ICD-10-CM

## 2024-03-20 DIAGNOSIS — K200 Eosinophilic esophagitis: Secondary | ICD-10-CM

## 2024-03-20 MED ORDER — SODIUM CHLORIDE 0.9 % IV SOLN: 500.0000 mL | Freq: Once | INTRAVENOUS | Status: DC

## 2024-03-20 NOTE — Progress Notes (Signed)
 1157 BP 153/113, Labetalol given IV, MD update, vss

## 2024-03-20 NOTE — Patient Instructions (Signed)
-   Advance diet as tolerated to soft. Would avoid chicken / beef to reduce risk for impaction until lumen is opened further. - Continue present medications (omeprazole  daily) - Repeat upper endoscopy in 3 weeks or so for retreatment if patient is willing, will discuss with him.   YOU HAD AN ENDOSCOPIC PROCEDURE TODAY AT THE [facility]:   Refer to the procedure report that was given to you for any specific questions about what was found during the examination.  If the procedure report does not answer your questions, please call your gastroenterologist to clarify.  If you requested that your care partner not be given the details of your procedure findings, then the procedure report has been included in a sealed envelope for you to review at your convenience later.  YOU SHOULD EXPECT: Some feelings of bloating in the abdomen. Passage of more gas than usual.  Walking can help get rid of the air that was put into your GI tract during the procedure and reduce the bloating. If you had a lower endoscopy (such as a colonoscopy or flexible sigmoidoscopy) you may notice spotting of blood in your stool or on the toilet paper. If you underwent a bowel prep for your procedure, you may not have a normal bowel movement for a few days.  Please Note:  You might notice some irritation and congestion in your nose or some drainage.  This is from the oxygen used during your procedure.  There is no need for concern and it should clear up in a day or so.  SYMPTOMS TO REPORT IMMEDIATELY:  Following upper endoscopy (EGD)  Vomiting of blood or coffee ground material  New chest pain or pain under the shoulder blades  Painful or persistently difficult swallowing  New shortness of breath  Fever of 100F or higher  Black, tarry-looking stools  For urgent or emergent issues, a gastroenterologist can be reached at any hour by calling [phone]. Do not use MyChart messaging for urgent concerns.    DIET:  We  do recommend a small meal at first, but then you may proceed to your regular diet.  Drink plenty of fluids but you should avoid alcoholic beverages for 24 hours.  ACTIVITY:  You should plan to take it easy for the rest of today and you should NOT DRIVE or use heavy machinery until tomorrow (because of the sedation medicines used during the test).    FOLLOW UP: Our staff will call the number listed on your records the next business day following your procedure.  We will call around 7:15- 8:00 am to check on you and address any questions or concerns that you may have regarding the information given to you following your procedure. If we do not reach you, we will leave a message.     If any biopsies were taken you will be contacted by phone or by letter within the next 1-3 weeks.  Please call us  at [phone] if you have not heard about the biopsies in 3 weeks.    SIGNATURES/CONFIDENTIALITY: You and/or your care partner have signed paperwork which will be entered into your electronic medical record.  These signatures attest to the fact that that the information above on your After Visit Summary has been reviewed and is understood.  Full responsibility of the confidentiality of this discharge information lies with you and/or your care-partner.

## 2024-03-20 NOTE — Progress Notes (Signed)
 1141 Robinul  0.1 mg IV given due large amount of secretions upon assessment.  MD made aware, vss

## 2024-03-20 NOTE — Telephone Encounter (Signed)
 Patient checked in this morning and was informed that his insurance was out of network.  Patient was aware and stated he was taking care of this with his insurance.  I offered an estimate and patient declined.

## 2024-03-20 NOTE — Op Note (Signed)
 [facility] Patient Name: Curtis Grimes Procedure Date: 03/20/2024 11:32 AM MRN: [id number] Endoscopist: Elspeth P. Leigh , MD, [id number] Age: 39 Referring MD:  Date of Birth: April 18, 1985 Gender: Male Account #: [id number] Procedure:                Upper GI endoscopy Indications:              Dysphagia, For therapy of esophageal stricture -                            history of EoE, EGD 03/05/24 - severe GEJ stricture                            dilated with the scope itself. Biopsies showed EoE                            is under good control on omeprazole  20mg  / day.                            Here for repeat EGD to dilate the stricture further. Medicines:                Monitored Anesthesia Care Procedure:                Pre-Anesthesia Assessment:                           - Prior to the procedure, a History and Physical                            was performed, and patient medications and                            allergies were reviewed. The patient's tolerance of                            previous anesthesia was also reviewed. The risks                            and benefits of the procedure and the sedation                            options and risks were discussed with the patient.                            All questions were answered, and informed consent                            was obtained. Prior Anticoagulants: The patient has                            taken no anticoagulant or antiplatelet agents. ASA                            Grade Assessment: III - A patient with severe  systemic disease. After reviewing the risks and                            benefits, the patient was deemed in satisfactory                            condition to undergo the procedure.                           After obtaining informed consent, the endoscope was                            passed under direct vision. Throughout the                             procedure, the patient's blood pressure, pulse, and                            oxygen saturations were monitored continuously. The                            GIF [id number] #[id number] was introduced through the                            mouth, and advanced to the second part of duodenum.                            The upper GI endoscopy was accomplished without                            difficulty. The patient tolerated the procedure                            well. Scope In: Scope Out: Findings:                 Esophagogastric landmarks were identified: the                            Z-line was found at 41 cm, the gastroesophageal                            junction was found at 41 cm and the upper extent of                            the gastric folds was found at 41 cm from the                            incisors.                           One benign-appearing, intrinsic stenosis was found                            41 cm from the incisors.  This stenosis measured                            less than one cm (in length). The stenosis was                            traversed with the scope itself which produced                            significant dilation effect itself. Given the                            findings from passing the scope itself, further                            balloon dilation was not pursued. The stricture was                            biopsied with a cold forceps for opening the                            stricture further (not sent to pathology, benign                            stricture). Given significant stricturing that has                            persisted, the stricture was successfully injected                            with 1 mL of triamcinolone (40 mg/mL) for drug                            delivery (1cc diluted with 3 cc's of saline - 10mg                             injected in 4 quadrants, total of 40mg  injected).                           There were  subtle changes of EoE in the lower                            esophagus - previously biopsied and showed no                            active EoE, further biopsies not taken. The exam of                            the esophagus was otherwise normal.                           The entire examined stomach was normal.  The examined duodenum was normal. Complications:            No immediate complications. Estimated blood loss:                            Minimal. Estimated Blood Loss:     Estimated blood loss was minimal. Impression:               - Esophagogastric landmarks identified.                           - Benign-appearing esophageal stenosis dilated with                            the scope itself. Biopsied to open it further.                            Injected with triamcinolone.                           - Mild change of EoE in the distal esophagus,                            normal esophagus otherwise.                           - Normal esophagus otherwise.                           - Normal stomach.                           - Normal examined duodenum. Recommendation:           - Patient has a contact number available for                            emergencies. The signs and symptoms of potential                            delayed complications were discussed with the                            patient. Return to normal activities tomorrow.                            Written discharge instructions were provided to the                            patient.                           - Advance diet as tolerated to soft. Would avoid                            chicken / beef to reduce risk for impaction until  lumen is opened further.                           - Continue present medications (omeprazole  daily)                           - Repeat upper endoscopy in 3 weeks or so for                            retreatment if patient is  willing, will discuss                            with him. Elspeth P. Laramie Gelles, MD 03/20/2024 12:10:41 PM This report has been signed electronically.

## 2024-03-20 NOTE — Progress Notes (Signed)
 Called to room to assist during endoscopic procedure.  Patient ID and intended procedure confirmed with present staff. Received instructions for my participation in the procedure from the performing physician.

## 2024-03-20 NOTE — Progress Notes (Signed)
 Report given to PACU, vss

## 2024-03-20 NOTE — Progress Notes (Signed)
 [facility] History and Physical   Primary Care Physician:  Knute Thersia Bitters, FNP   Reason for Procedure:   Esophageal stricture / dysphagia  Plan:    Egd with dilation      HPI: Curtis Grimes is a 39 y.o. male  here for repeat EGD with dilation. EGD 1/7 - GEJ stricture dilated with the endoscope itself. EoE appears controlled on PPI.  Here for repeat EGD with dilation as appropriate.   Otherwise feels well without any cardiopulmonary symptoms.   I have discussed risks / benefits of anesthesia and endoscopic procedure with [name] and they wish to proceed with the exams as outlined today.   The patient was provided an opportunity to ask questions and all were answered. The patient agreed with the plan.    Past Medical History:  Diagnosis Date   Allergy     Phreesia 02/11/2020   Depression    Dysphagia    Eosinophilic esophagitis    High cholesterol    Meningitis    Stroke (cerebrum) (HCC) 02/2023    Past Surgical History:  Procedure Laterality Date   CAUTERIZE INNER NOSE     childhood   VASECTOMY      Prior to Admission medications  Medication Sig Start Date End Date Taking? Authorizing Provider  aspirin  EC 81 MG tablet Take 1 tablet (81 mg total) by mouth daily. Swallow whole. 03/21/23  Yes Vernon Ranks, MD  atorvastatin  (LIPITOR) 20 MG tablet Take 1 tablet by mouth once daily 09/17/23  Yes Caudle, Alexis Olivia, FNP  clopidogrel  (PLAVIX ) 75 MG tablet Take 75 mg by mouth daily. Patient not taking: Reported on 03/20/2024 01/03/24   [provider]    Current Outpatient Medications  Medication Sig Dispense Refill   aspirin  EC 81 MG tablet Take 1 tablet (81 mg total) by mouth daily. Swallow whole. 30 tablet 12   atorvastatin  (LIPITOR) 20 MG tablet Take 1 tablet by mouth once daily 90 tablet 3   clopidogrel  (PLAVIX ) 75 MG tablet Take 75 mg by mouth daily. (Patient not taking: Reported on 03/20/2024)     Current Facility-Administered  Medications  Medication Dose Route Frequency Provider Last Rate Last Admin   0.9 %  sodium chloride  infusion  500 mL Intravenous Once Maria Coin, Elspeth SQUIBB, MD        Allergies as of 03/20/2024 - Review Complete 03/20/2024  Allergen Reaction Noted   Bovine (beef) protein-containing drug products Swelling 02/11/2020   Other Swelling 02/11/2020   Shellfish allergy  Swelling 02/11/2020   Thick-it chicken a la king [nutritional supplements] Swelling 02/11/2020    Family History  Problem Relation Age of Onset   Diabetes Mother    Heart disease Mother    Hypertension Mother    Diabetes Maternal Grandmother    Heart disease Maternal Grandmother    Stroke Maternal Grandmother    Hypertension Paternal Grandmother    Colon cancer Neg Hx    Esophageal cancer Neg Hx    Rectal cancer Neg Hx    Stomach cancer Neg Hx     Social History   Socioeconomic History   Marital status: Divorced    Spouse name: Not on file   Number of children: 2   Years of education: Not on file   Highest education level: Bachelor's degree (e.g., BA, AB, BS)  Occupational History   Not on file  Tobacco Use   Smoking status: Never   Smokeless tobacco: Never  Vaping Use   Vaping status: Never  Used  Substance and Sexual Activity   Alcohol use: Yes    Comment: occ   Drug use: Never   Sexual activity: Yes  Other Topics Concern   Not on file  Social History Narrative   Pt lives alone    Pt works    Social Drivers of Health   Tobacco Use: Low Risk (03/20/2024)   Patient History    Smoking Tobacco Use: Never    Smokeless Tobacco Use: Never    Passive Exposure: Not on file  Financial Resource Strain: Low Risk (03/23/2023)   Overall Financial Resource Strain (CARDIA)    Difficulty of Paying Living Expenses: Not hard at all  Food Insecurity: No Food Insecurity (03/23/2023)   Hunger Vital Sign    Worried About Running Out of Food in the Last Year: Never true    Ran Out of Food in the Last Year: Never true   Transportation Needs: No Transportation Needs (03/23/2023)   PRAPARE - Administrator, Civil Service (Medical): No    Lack of Transportation (Non-Medical): No  Physical Activity: Insufficiently Active (03/23/2023)   Exercise Vital Sign    Days of Exercise per Week: 3 days    Minutes of Exercise per Session: 40 min  Stress: No Stress Concern Present (03/23/2023)   [employer] of Occupational Health - Occupational Stress Questionnaire    Feeling of Stress : Only a little  Social Connections: Socially Isolated (03/23/2023)   Social Connection and Isolation Panel    Frequency of Communication with Friends and Family: More than three times a week    Frequency of Social Gatherings with Friends and Family: Once a week    Attends Religious Services: Never    Database Administrator or Organizations: No    Attends Engineer, Structural: Not on file    Marital Status: Divorced  Intimate Partner Violence: Not At Risk (03/19/2023)   Humiliation, Afraid, Rape, and Kick questionnaire    Fear of Current or Ex-Partner: No    Emotionally Abused: No    Physically Abused: No    Sexually Abused: No  Depression (PHQ2-9): Low Risk (10/10/2023)   Depression (PHQ2-9)    PHQ-2 Score: 0  Alcohol Screen: Low Risk (04/09/2023)   Alcohol Screen    Last Alcohol Screening Score (AUDIT): 0  Housing: Low Risk (03/23/2023)   Housing Stability Vital Sign    Unable to Pay for Housing in the Last Year: No    Number of Times Moved in the Last Year: 0    Homeless in the Last Year: No  Utilities: Not At Risk (03/19/2023)   [employer] Utilities    Threatened with loss of utilities: No  Health Literacy: Adequate Health Literacy (04/09/2023)   B1300 Health Literacy    Frequency of need for help with medical instructions: Never    Review of Systems: All other review of systems negative except as mentioned in the HPI.  Physical Exam: Vital signs BP (!) 164/102   Pulse 97   Temp 99.5 F (37.5 C)  (Temporal)   Ht 5' 11 (1.803 m)   Wt 177 lb (80.3 kg)   SpO2 99%   BMI 24.69 kg/m   General:   Alert,  Well-developed, pleasant and cooperative in NAD Lungs:  Clear throughout to auscultation.   Heart:  Regular rate and rhythm Abdomen:  Soft, nontender and nondistended.   Neuro/Psych:  Alert and cooperative. Normal mood and affect. A and O x 3  Marcey Naval,  MD [facility]

## 2024-03-20 NOTE — Progress Notes (Signed)
 Pt's states no medical or surgical changes since previsit or office visit.

## 2024-03-21 ENCOUNTER — Telehealth: Payer: Self-pay

## 2024-03-21 NOTE — Telephone Encounter (Signed)
" °  Follow up Call-     03/20/2024   10:45 AM 03/05/2024    1:37 PM 01/18/2024    8:04 AM  Call back number  Post procedure Call Back phone  # [phone]  Permission to leave phone message Yes Yes Yes     Patient questions:  Do you have a fever, pain , or abdominal swelling? No. Pain Score  0 *  Have you tolerated food without any problems? Yes.    Have you been able to return to your normal activities? Yes.    Do you have any questions about your discharge instructions: Diet   No. Medications  No. Follow up visit  No.  Do you have questions or concerns about your Care? No.  Actions: * If pain score is 4 or above: No action needed, pain <4.   "

## 2024-03-25 ENCOUNTER — Ambulatory Visit: Admitting: Gastroenterology

## 2024-03-25 NOTE — Progress Notes (Unsigned)
 "  HPI :  39 year old male with history of eosinophilic esophagitis and distal esophageal stricture presents for evaluation of recurrent dysphagia.  Not currently on PPI or any treatment for EOE.     History of Present Illness He has a history of eosinophilic esophagitis and esophageal stricture, with the last upper endoscopy and dilation performed in 2020. His symptoms have persisted longer than expected before seeking care.   He experiences difficulty swallowing, even with soft foods, and has had to crush his cholesterol medication to take it with applesauce. A couple of weeks ago, an 81 mg aspirin  got stuck in his chest, which he managed to swallow by stretching his neck. He now keeps carbonated drinks nearby during meals as they help push food down when it feels stuck, a sensation that occurs almost every meal.   He is currently eating softer foods such as pasta, salads, and mashed potatoes to minimize the risk of food getting stuck. He was previously taking omeprazole  but only uses it as needed. No heartburn, indigestion, or unusual weight loss.   He recently required a liquid form of a medication due to difficulty swallowing pills. He denies vomiting food back up and is very careful with his diet to avoid complications. No weight loss has been experienced.   GI HISTORY:   12/2018 last EGD by Dr. Leigh: Moderate distal esophageal stricture dilated with balloon dilator to 14.5 mm.  Active eosinophilic esophagitis in the middle and lower esophagus.  Normal stomach and duodenum.  Was started on Nexium 40 mg daily.   11/2017 EGD by Dr. Leigh: Distal esophageal stenosis dilated to 17 mm.  Eosinophilic esophagitis.   10/30/2018 patient saw allergist.  At that time, he had continue to avoid shellfish, pork, turkey, chicken, beef and Lamb.  The plan was to reintroduce beef and lamb first back in the diet followed by pork after his GI appointment.    RAYSHARD SCHIRTZINGER is a 39 y.o. y/o male has  been referred for:    1.  Recurrent dysphagia 2.  Eosinophilic esophagitis 3.  History of esophageal stricture last dilated 12/2018. Assessment & Plan Eosinophilic esophagitis with recurrent esophageal stricture and dysphagia. - Discussed long-term treatment options including daily proton pump inhibitors, budesonide , and Dupixent.  I stressed the importance of taking daily treatment for EOE.  Patient education handout was given. - Scheduled repeat EGD with dilation by Dr. Leigh. - I discussed risks of EGD with dilation with patient to include risk of bleeding, perforation, and risk of sedation.  Patient expressed understanding and agrees to proceed with EGD.  - Initiated OTC omeprazole  40 mg daily, either two tablets once a day or one tablet twice a day.  Patient declined prescription PPI. - Discussed potential need for multiple dilations if stricture is severe. - Educated on dietary modifications to manage symptoms.   Follow up office visit in 2 months with Dr. Leigh.  Also follow-up based on EGD results.   EGD 03/05/24: - Esophagogastric landmarks were identified: the Z-line was found at 41 cm, the gastroesophageal junction was found at 41 cm and the upper extent of the gastric folds was found at 41 cm from the incisors. Findings: - One benign-appearing, intrinsic severe stenosis was found 41 cm from the incisors. This stenosis measured less than one cm (in length) and roughly 6-74mm in diameter. The stenosis was traversed with the endoscope with mild resistance. The stricture was dilated with passage of the upper endoscope itself, no further dilation was performed  given dilation effect seen following passage of the scope. - Mucosal changes including subtle ringed esophagus and subtle longitudinal furrows were found in the entire esophagus. Biopsies were obtained from the proximal and distal esophagus with cold forceps for activity of suspected eosinophilic esophagitis. - The exam of the  esophagus was otherwise normal. - The entire examined stomach was normal. - The examined duodenum was normal.  FINAL DIAGNOSIS        1. Surgical [P], esophageal :       -  SQUAMOUS MUCOSA WITH BASAL CELL HYPERPLASIA AND PAPILLARY ELONGATION,       SUGGESTIVE OF REFLUX.    EGD 03/20/24: - Esophagogastric landmarks were identified: the Z-line was found at 41 cm, the gastroesophageal junction was found at 41 cm and the upper extent of the gastric folds was found at 41 cm from the incisors. Findings: - One benign-appearing, intrinsic stenosis was found 41 cm from the incisors. This stenosis measured less than one cm (in length). The stenosis was traversed with the scope itself which produced significant dilation effect itself. Given the findings from passing the scope itself, further balloon dilation was not pursued. The stricture was biopsied with a cold forceps for opening the stricture further (not sent to pathology, benign stricture). Given significant stricturing that has persisted, the stricture was successfully injected with 1 mL of triamcinolone (40 mg/mL) for drug delivery (1cc diluted with 3 cc's of saline - 10mg  injected in 4 quadrants, total of 40mg  injected). - There were subtle changes of EoE in the lower esophagus - previously biopsied and showed no active EoE, further biopsies not taken. The exam of the esophagus was otherwise normal. - The entire examined stomach was normal. - The examined duodenum was normal.       Past Medical History:  Diagnosis Date   Allergy     Phreesia 02/11/2020   Depression    Dysphagia    Eosinophilic esophagitis    High cholesterol    Meningitis    Stroke (cerebrum) (HCC) 02/2023     Past Surgical History:  Procedure Laterality Date   CAUTERIZE INNER NOSE     childhood   VASECTOMY     Family History  Problem Relation Age of Onset   Diabetes Mother    Heart disease Mother    Hypertension Mother    Diabetes Maternal Grandmother    Heart  disease Maternal Grandmother    Stroke Maternal Grandmother    Hypertension Paternal Grandmother    Colon cancer Neg Hx    Esophageal cancer Neg Hx    Rectal cancer Neg Hx    Stomach cancer Neg Hx    Social History[1] Current Outpatient Medications  Medication Sig Dispense Refill   aspirin  EC 81 MG tablet Take 1 tablet (81 mg total) by mouth daily. Swallow whole. 30 tablet 12   atorvastatin  (LIPITOR) 20 MG tablet Take 1 tablet by mouth once daily 90 tablet 3   clopidogrel  (PLAVIX ) 75 MG tablet Take 75 mg by mouth daily. (Patient not taking: Reported on 03/20/2024)     No current facility-administered medications for this visit.   Allergies[2]   Review of Systems: All systems reviewed and negative except where noted in HPI.    No results found.  Physical Exam: There were no vitals taken for this visit. Constitutional: Pleasant,well-developed, ***male in no acute distress. HEENT: Normocephalic and atraumatic. Conjunctivae are normal. No scleral icterus. Neck supple.  Cardiovascular: Normal rate, regular rhythm.  Pulmonary/chest: Effort normal and breath  sounds normal. No wheezing, rales or rhonchi. Abdominal: Soft, nondistended, nontender. Bowel sounds active throughout. There are no masses palpable. No hepatomegaly. Extremities: no edema Lymphadenopathy: No cervical adenopathy noted. Neurological: Alert and oriented to person place and time. Skin: Skin is warm and dry. No rashes noted. Psychiatric: Normal mood and affect. Behavior is normal.   ASSESSMENT: 39 y.o. male here for assessment of the following  No diagnosis found.  PLAN:   Knute Thersia Bitters, FNP    [1]  Social History Tobacco Use   Smoking status: Never   Smokeless tobacco: Never  Vaping Use   Vaping status: Never Used  Substance Use Topics   Alcohol use: Yes    Comment: occ   Drug use: Never  [2]  Allergies Allergen Reactions   Bovine (Beef) Protein-Containing Drug Products Swelling     Throat swelling- can't have any type of meats   Other Swelling    Turkey    Shellfish Allergy  Swelling   Thick-It Chicken [employer] [Nutritional Supplements] Swelling    Chicken -can't have any type of meats, causes throat swelling   "

## 2024-04-14 ENCOUNTER — Ambulatory Visit: Admitting: Neurology

## 2024-04-17 ENCOUNTER — Encounter: Admitting: Gastroenterology

## 2024-10-13 ENCOUNTER — Encounter (HOSPITAL_BASED_OUTPATIENT_CLINIC_OR_DEPARTMENT_OTHER): Admitting: Family Medicine
